# Patient Record
Sex: Female | Born: 1990 | Race: White | Hispanic: No | Marital: Married | Smoking: Never smoker
Health system: Southern US, Community
[De-identification: ages and names within clinical notes are randomized; demographics above are authoritative.]

## PROBLEM LIST (undated history)

## (undated) ENCOUNTER — Inpatient Hospital Stay (HOSPITAL_COMMUNITY): Payer: Self-pay

## (undated) DIAGNOSIS — J45909 Unspecified asthma, uncomplicated: Secondary | ICD-10-CM

## (undated) DIAGNOSIS — E039 Hypothyroidism, unspecified: Secondary | ICD-10-CM

## (undated) DIAGNOSIS — R112 Nausea with vomiting, unspecified: Secondary | ICD-10-CM

## (undated) DIAGNOSIS — Z9889 Other specified postprocedural states: Secondary | ICD-10-CM

## (undated) HISTORY — DX: Hypothyroidism, unspecified: E03.9

## (undated) HISTORY — DX: Unspecified asthma, uncomplicated: J45.909

---
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
## 2009-05-30 ENCOUNTER — Inpatient Hospital Stay (HOSPITAL_COMMUNITY): Admission: AD | Admit: 2009-05-30 | Discharge: 2009-06-03 | Payer: Self-pay | Admitting: Obstetrics and Gynecology

## 2009-06-05 ENCOUNTER — Inpatient Hospital Stay (HOSPITAL_COMMUNITY): Admission: AD | Admit: 2009-06-05 | Discharge: 2009-06-05 | Payer: Self-pay | Admitting: Obstetrics and Gynecology

## 2010-05-06 LAB — COMPREHENSIVE METABOLIC PANEL
AST: 21 U/L (ref 0–37)
Alkaline Phosphatase: 152 U/L — ABNORMAL HIGH (ref 39–117)
CO2: 24 mEq/L (ref 19–32)
Creatinine, Ser: 0.58 mg/dL (ref 0.4–1.2)
Glucose, Bld: 94 mg/dL (ref 70–99)
Potassium: 3.1 mEq/L — ABNORMAL LOW (ref 3.5–5.1)
Sodium: 136 mEq/L (ref 135–145)
Total Bilirubin: 0.7 mg/dL (ref 0.3–1.2)

## 2010-05-06 LAB — DIFFERENTIAL
Eosinophils Absolute: 0.2 10*3/uL (ref 0.0–0.7)
Neutro Abs: 13.5 10*3/uL — ABNORMAL HIGH (ref 1.7–7.7)
Neutrophils Relative %: 87 % — ABNORMAL HIGH (ref 43–77)

## 2010-05-06 LAB — CBC
HCT: 24.4 % — ABNORMAL LOW (ref 36.0–46.0)
Hemoglobin: 8.1 g/dL — ABNORMAL LOW (ref 12.0–15.0)
Hemoglobin: 8.4 g/dL — ABNORMAL LOW (ref 12.0–15.0)
MCV: 84.8 fL (ref 78.0–100.0)
MCV: 86.6 fL (ref 78.0–100.0)
RBC: 2.82 MIL/uL — ABNORMAL LOW (ref 3.87–5.11)
RBC: 2.84 MIL/uL — ABNORMAL LOW (ref 3.87–5.11)
RBC: 2.89 MIL/uL — ABNORMAL LOW (ref 3.87–5.11)
RDW: 14.7 % (ref 11.5–15.5)
RDW: 15.1 % (ref 11.5–15.5)
RDW: 15.1 % (ref 11.5–15.5)

## 2010-05-06 LAB — URINALYSIS, ROUTINE W REFLEX MICROSCOPIC
Bilirubin Urine: NEGATIVE
Glucose, UA: NEGATIVE mg/dL
Ketones, ur: NEGATIVE mg/dL
Urobilinogen, UA: 1 mg/dL (ref 0.0–1.0)

## 2010-05-06 LAB — URIC ACID: Uric Acid, Serum: 5.9 mg/dL (ref 2.4–7.0)

## 2010-05-06 LAB — LACTATE DEHYDROGENASE: LDH: 174 U/L (ref 94–250)

## 2010-05-07 LAB — CBC
HCT: 35.3 % — ABNORMAL LOW (ref 36.0–46.0)
Hemoglobin: 11.1 g/dL — ABNORMAL LOW (ref 12.0–15.0)
Hemoglobin: 11.9 g/dL — ABNORMAL LOW (ref 12.0–15.0)
MCHC: 33.6 g/dL (ref 30.0–36.0)
MCHC: 33.8 g/dL (ref 30.0–36.0)
Platelets: 214 10*3/uL (ref 150–400)
RBC: 3.85 MIL/uL — ABNORMAL LOW (ref 3.87–5.11)
WBC: 14 10*3/uL — ABNORMAL HIGH (ref 4.0–10.5)

## 2010-05-07 LAB — COMPREHENSIVE METABOLIC PANEL
ALT: 18 U/L (ref 0–35)
Albumin: 2.9 g/dL — ABNORMAL LOW (ref 3.5–5.2)
CO2: 24 mEq/L (ref 19–32)
Chloride: 101 mEq/L (ref 96–112)
GFR calc non Af Amer: >60 mL/min (ref >60)

## 2010-05-07 LAB — RPR: RPR Ser Ql: NONREACTIVE

## 2012-04-17 ENCOUNTER — Encounter: Payer: Self-pay | Admitting: Family Medicine

## 2012-04-17 DIAGNOSIS — E039 Hypothyroidism, unspecified: Secondary | ICD-10-CM | POA: Insufficient documentation

## 2012-04-17 DIAGNOSIS — J683 Other acute and subacute respiratory conditions due to chemicals, gases, fumes and vapors: Secondary | ICD-10-CM | POA: Insufficient documentation

## 2013-01-04 ENCOUNTER — Encounter (INDEPENDENT_AMBULATORY_CARE_PROVIDER_SITE_OTHER): Payer: Self-pay

## 2013-01-04 ENCOUNTER — Ambulatory Visit: Payer: BC Managed Care – PPO | Admitting: Family Medicine

## 2013-01-20 ENCOUNTER — Other Ambulatory Visit: Payer: Self-pay | Admitting: Obstetrics and Gynecology

## 2013-03-16 ENCOUNTER — Telehealth: Payer: Self-pay | Admitting: *Deleted

## 2013-03-16 NOTE — Telephone Encounter (Signed)
Mom had called the request for pt. I explained to mom she needs to be seen per Dr. Christell ConstantMoore. Verbalized understanding.

## 2013-03-16 NOTE — Telephone Encounter (Signed)
Patient really needs to come in and be seen I do not think she has been here in almost a year

## 2013-03-16 NOTE — Telephone Encounter (Signed)
She was exposed to flu. We treated someone that was a family member and wants to know if we will call in med for her. Sore throat, cough, chest congestion. Family member got zpak and flu medicine. [facility] [employer]

## 2013-03-21 ENCOUNTER — Telehealth: Payer: Self-pay | Admitting: Family Medicine

## 2013-03-21 ENCOUNTER — Encounter: Payer: Self-pay | Admitting: Family Medicine

## 2013-03-21 ENCOUNTER — Ambulatory Visit (INDEPENDENT_AMBULATORY_CARE_PROVIDER_SITE_OTHER): Payer: BC Managed Care – PPO | Admitting: Family Medicine

## 2013-03-21 VITALS — BP 129/83 | HR 102 | Temp 98.9°F | Ht 61.0 in | Wt 177.8 lb

## 2013-03-21 DIAGNOSIS — J069 Acute upper respiratory infection, unspecified: Secondary | ICD-10-CM

## 2013-03-21 MED ORDER — MECLIZINE HCL 25 MG PO TABS: 25.0000 mg | ORAL_TABLET | Freq: Three times a day (TID) | ORAL | Status: DC | PRN

## 2013-03-21 MED ORDER — METHYLPREDNISOLONE (PAK) 4 MG PO TABS: ORAL_TABLET | ORAL | Status: DC

## 2013-03-21 MED ORDER — AZITHROMYCIN 250 MG PO TABS: ORAL_TABLET | ORAL | Status: DC

## 2013-03-21 NOTE — Telephone Encounter (Signed)
Head congestion, nausea, and dizziness for several days. Symptoms aren't improving. Appt scheduled for this afternoon. Patient aware.

## 2013-03-21 NOTE — Progress Notes (Signed)
   Subjective:    Patient ID: Sara Clarke, female    DOB: 08/16/1990, 23 y.o.   MRN: [id number]  HPI  This 23 y.o. female presents for evaluation of uri sx's.  Review of Systems    No chest pain, SOB, HA, dizziness, vision change, N/V, diarrhea, constipation, dysuria, urinary urgency or frequency, myalgias, arthralgias or rash.  Objective:   Physical Exam  Vital signs noted  Well developed well nourished female.  HEENT - Head atraumatic Normocephalic                Eyes - PERRLA, Conjuctiva - clear Sclera- Clear EOMI                Ears - EAC's Wnl TM's Wnl Gross Hearing WNL                Nose - Nares patent                 Throat - oropharanx wnl Respiratory - Lungs CTA bilateral Cardiac - RRR S1 and S2 without murmur GI - Abdomen soft Nontender and bowel sounds active x 4 Extremities - No edema. Neuro - Grossly intact.      Assessment & Plan:  URI (upper respiratory infection) - Plan: methylPREDNIsolone (MEDROL DOSPACK) 4 MG tablet, azithromycin (ZITHROMAX) 250 MG tablet, meclizine (ANTIVERT) 25 MG tablet  Push po fluids, rest, tylenol and motrin otc prn as directed for fever, arthralgias, and myalgias.  Follow up prn if sx's continue or persist.  Deatra CanterWilliam J Lennard Capek FNP

## 2013-04-03 MED ORDER — AMOXICILLIN 500 MG PO CAPS: 500.0000 mg | ORAL_CAPSULE | Freq: Three times a day (TID) | ORAL | Status: DC

## 2013-04-03 NOTE — Telephone Encounter (Signed)
Patient at work and can not get off work and is complaining with sinus infection again, coughing, and sore throat is there any way we can call her something in

## 2013-04-20 ENCOUNTER — Ambulatory Visit (INDEPENDENT_AMBULATORY_CARE_PROVIDER_SITE_OTHER): Payer: BC Managed Care – PPO | Admitting: Family Medicine

## 2013-04-20 ENCOUNTER — Encounter: Payer: Self-pay | Admitting: Family Medicine

## 2013-04-20 VITALS — BP 116/79 | HR 89 | Temp 98.6°F | Wt 178.8 lb

## 2013-04-20 DIAGNOSIS — J209 Acute bronchitis, unspecified: Secondary | ICD-10-CM

## 2013-04-20 MED ORDER — OMEPRAZOLE 40 MG PO CPDR: 40.0000 mg | DELAYED_RELEASE_CAPSULE | Freq: Every day | ORAL | Status: DC

## 2013-04-20 MED ORDER — CIPROFLOXACIN HCL 500 MG PO TABS: 500.0000 mg | ORAL_TABLET | Freq: Two times a day (BID) | ORAL | Status: DC

## 2013-04-20 MED ORDER — HYDROCODONE-HOMATROPINE 5-1.5 MG/5ML PO SYRP: 5.0000 mL | ORAL_SOLUTION | Freq: Three times a day (TID) | ORAL | Status: DC | PRN

## 2013-04-20 NOTE — Progress Notes (Signed)
   Subjective:    Patient ID: Sara Clarke, female    DOB: 04/10/1990, 23 y.o.   MRN: [id number]  HPI This 23 y.o. female presents for evaluation of cough and uri sx's for over a week.   Review of Systems No chest pain, SOB, HA, dizziness, vision change, N/V, diarrhea, constipation, dysuria, urinary urgency or frequency, myalgias, arthralgias or rash.     Objective:   Physical Exam  Vital signs noted  Well developed well nourished female.  HEENT - Head atraumatic Normocephalic                Eyes - PERRLA, Conjuctiva - clear Sclera- Clear EOMI                Ears - EAC's Wnl TM's Wnl Gross Hearing WNL Respiratory - Lungs CTA bilateral Cardiac - RRR S1 and S2 without murmur GI - Abdomen soft Nontender and bowel sounds active x 4 Extremities - No edema. Neuro - Grossly intact.      Assessment & Plan:  Acute bronchitis - Plan: HYDROcodone-homatropine (HYCODAN) 5-1.5 MG/5ML syrup Cipro 500mg  one po bid x 10 days  Push po fluids, rest, tylenol and motrin otc prn as directed for fever, arthralgias, and myalgias.  Follow up prn if sx's continue or persist.  Deatra CanterWilliam J Oxford FNP

## 2013-05-01 ENCOUNTER — Telehealth: Payer: Self-pay | Admitting: Nurse Practitioner

## 2013-05-01 ENCOUNTER — Telehealth: Payer: Self-pay | Admitting: Family Medicine

## 2013-05-01 ENCOUNTER — Other Ambulatory Visit: Payer: Self-pay | Admitting: Family Medicine

## 2013-05-01 MED ORDER — FLUCONAZOLE 150 MG PO TABS
150.0000 mg | ORAL_TABLET | Freq: Once | ORAL | Status: DC
Start: 2013-05-01 — End: 2014-11-08

## 2013-05-01 NOTE — Telephone Encounter (Signed)
Error, should have gone to [facility]

## 2014-07-11 ENCOUNTER — Encounter (HOSPITAL_BASED_OUTPATIENT_CLINIC_OR_DEPARTMENT_OTHER): Payer: Self-pay | Admitting: *Deleted

## 2014-07-11 ENCOUNTER — Emergency Department (HOSPITAL_BASED_OUTPATIENT_CLINIC_OR_DEPARTMENT_OTHER)
Admission: EM | Admit: 2014-07-11 | Discharge: 2014-07-12 | Disposition: A | Discharge status: Home / Self Care | Payer: BLUE CROSS/BLUE SHIELD | Attending: Emergency Medicine | Admitting: Emergency Medicine

## 2014-07-11 ENCOUNTER — Emergency Department (HOSPITAL_BASED_OUTPATIENT_CLINIC_OR_DEPARTMENT_OTHER): Payer: BLUE CROSS/BLUE SHIELD

## 2014-07-11 DIAGNOSIS — Z79899 Other long term (current) drug therapy: Secondary | ICD-10-CM | POA: Diagnosis not present

## 2014-07-11 DIAGNOSIS — W108XXA Fall (on) (from) other stairs and steps, initial encounter: Secondary | ICD-10-CM | POA: Insufficient documentation

## 2014-07-11 DIAGNOSIS — Y9389 Activity, other specified: Secondary | ICD-10-CM | POA: Diagnosis not present

## 2014-07-11 DIAGNOSIS — J45909 Unspecified asthma, uncomplicated: Secondary | ICD-10-CM | POA: Diagnosis not present

## 2014-07-11 DIAGNOSIS — Y998 Other external cause status: Secondary | ICD-10-CM | POA: Insufficient documentation

## 2014-07-11 DIAGNOSIS — Y9289 Other specified places as the place of occurrence of the external cause: Secondary | ICD-10-CM | POA: Insufficient documentation

## 2014-07-11 DIAGNOSIS — Z7951 Long term (current) use of inhaled steroids: Secondary | ICD-10-CM | POA: Diagnosis not present

## 2014-07-11 DIAGNOSIS — S93401A Sprain of unspecified ligament of right ankle, initial encounter: Secondary | ICD-10-CM | POA: Diagnosis not present

## 2014-07-11 DIAGNOSIS — S99921A Unspecified injury of right foot, initial encounter: Secondary | ICD-10-CM | POA: Diagnosis present

## 2014-07-11 NOTE — ED Notes (Addendum)
Pt c/o right  foot injury x 30 mins ago

## 2014-07-12 NOTE — Discharge Instructions (Signed)

## 2014-07-12 NOTE — ED Provider Notes (Signed)
CSN: [id number]     Arrival date & time 07/11/14  2229 History   First MD Initiated Contact with Patient 07/11/14 2318     Chief Complaint  Patient presents with  . Foot Injury     (Consider location/radiation/quality/duration/timing/severity/associated sxs/prior Treatment) Patient is a 24 y.o. female presenting with foot injury.  Foot Injury Location:  Ankle Time since incident:  30 minutes Injury: yes   Mechanism of injury: fall   Fall:    Fall occurred:  Down stairs   Point of impact:  Feet   Entrapped after fall: no   Ankle location:  R ankle Pain details:    Quality:  Aching   Radiates to:  Does not radiate   Severity:  Moderate   Onset quality:  Sudden   Timing:  Constant   Progression:  Unchanged Relieved by:  Nothing Worsened by:  Bearing weight and flexion   Past Medical History  Diagnosis Date  . Asthma   . Hypothyroidism    Past Surgical History  Procedure Laterality Date  . Cesarean section     Family History  Problem Relation Age of Onset  . Thyroid disease Mother   . Hypertension Mother   . Hypertension Father    History  Substance Use Topics  . Smoking status: Never Smoker   . Smokeless tobacco: Not on file  . Alcohol Use: No   OB History    No data available     Review of Systems  All other systems reviewed and are negative.     Allergies  Review of patient's allergies indicates no known allergies.  Home Medications   Prior to Admission medications   Medication Sig Start Date End Date Taking? Authorizing Provider  albuterol (PROVENTIL HFA;VENTOLIN HFA) 108 (90 BASE) MCG/ACT inhaler Inhale 2 puffs into the lungs every 6 (six) hours as needed for wheezing.    Historical Provider, MD  beclomethasone (QVAR) 40 MCG/ACT inhaler Inhale 2 puffs into the lungs 2 (two) times daily.    Historical Provider, MD  ciprofloxacin (CIPRO) 500 MG tablet Take 1 tablet (500 mg total) by mouth 2 (two) times daily. 04/20/13   Deatra Canter, FNP   fluconazole (DIFLUCAN) 150 MG tablet Take 1 tablet (150 mg total) by mouth once. 05/01/13   Deatra Canter, FNP  HYDROcodone-homatropine [facility]) 5-1.5 MG/5ML syrup Take 5 mLs by mouth every 8 (eight) hours as needed for cough. 04/20/13   Deatra Canter, FNP  meclizine (ANTIVERT) 25 MG tablet Take 1 tablet (25 mg total) by mouth 3 (three) times daily as needed for dizziness. 03/21/13   Deatra Canter, FNP   BP 131/73 mmHg  Pulse 92  Temp(Src) 98.2 F (36.8 C) (Oral)  Resp 16  Ht 5' 1.5" (1.562 m)  Wt 180 lb (81.647 kg)  BMI 33.46 kg/m2  SpO2 100%  LMP 07/04/2014 Physical Exam  Constitutional: She is oriented to person, place, and time. She appears well-developed and well-nourished.  HENT:  Head: Normocephalic and atraumatic.  Right Ear: External ear normal.  Left Ear: External ear normal.  Eyes: Conjunctivae and EOM are normal. Pupils are equal, round, and reactive to light.  Neck: Normal range of motion. Neck supple.  Cardiovascular: Normal rate, regular rhythm, normal heart sounds and intact distal pulses.   Pulmonary/Chest: Effort normal and breath sounds normal.  Abdominal: Soft. Bowel sounds are normal. There is no tenderness.  Musculoskeletal:       Right ankle: She exhibits swelling and ecchymosis. She  exhibits normal range of motion and normal pulse.  Neurological: She is alert and oriented to person, place, and time.  Skin: Skin is warm and dry.  Vitals reviewed.   ED Course  Procedures (including critical care time) Labs Review Labs Reviewed - No data to display  Imaging Review Dg Ankle Complete Right  07/11/2014   CLINICAL DATA:  Fall down steps 1 hour ago with twisted ankle. Initial encounter.  EXAM: RIGHT ANKLE - COMPLETE 3+ VIEW  COMPARISON:  None.  FINDINGS: There is no evidence of fracture, dislocation, or joint effusion. There is no evidence of arthropathy or other focal bone abnormality. Soft tissues are unremarkable.  IMPRESSION: Negative.    Electronically Signed   By: Marnee SpringJonathon  Watts M.D.   On: 07/11/2014 23:43     EKG Interpretation None      MDM   Final diagnoses:  Right ankle sprain, initial encounter    24 y.o. female without pertinent PMH presents with R ankle pain after twisting injury on stair.  XR negative.  Likely sprain.  Placed in splint, will fu with PCP.    I have reviewed all laboratory and imaging studies if ordered as above  1. Right ankle sprain, initial encounter         Mirian MoMatthew Kato Wieczorek, MD 07/12/14 [phone]

## 2014-07-13 ENCOUNTER — Encounter: Payer: Self-pay | Admitting: Family Medicine

## 2014-07-13 ENCOUNTER — Ambulatory Visit (INDEPENDENT_AMBULATORY_CARE_PROVIDER_SITE_OTHER): Payer: BLUE CROSS/BLUE SHIELD | Admitting: Family Medicine

## 2014-07-13 VITALS — BP 119/75 | HR 88 | Ht 62.0 in | Wt 186.0 lb

## 2014-07-13 DIAGNOSIS — S93401A Sprain of unspecified ligament of right ankle, initial encounter: Secondary | ICD-10-CM

## 2014-07-13 NOTE — Patient Instructions (Signed)
You have an ankle sprain. Ice the area for 15 minutes at a time, 3-4 times a day Aleve 2 tabs twice a day with food OR ibuprofen 3 tabs three times a day with food for pain and inflammation. Elevate above the level of your heart when possible Crutches if needed to help with walking Bear weight when tolerated Use laceup ankle brace to help with stability while you recover from this injury (ok to use boot for 7 days first if needed). Come out of the boot/brace twice a day to do Up/down and alphabet exercises 2-3 sets of each. Consider physical therapy for strengthening and balance exercises in the future. If not improving as expected, we may repeat x-rays or consider further testing like an MRI. Follow up with me in 2 weeks.

## 2014-07-17 ENCOUNTER — Telehealth: Payer: Self-pay | Admitting: Family Medicine

## 2014-07-17 DIAGNOSIS — M25571 Pain in right ankle and joints of right foot: Secondary | ICD-10-CM | POA: Insufficient documentation

## 2014-07-17 NOTE — Addendum Note (Signed)
Addended by: Lenda KelpHUDNALL, Klani Caridi R on: 07/17/2014 02:10 PM   Modules accepted: Level of Service

## 2014-07-17 NOTE — Progress Notes (Signed)
PCP: Rudi HeapMOORE, DONALD, MD  Subjective:   HPI: Patient is a 24 y.o. female here for ankle injury.  Patient reports on 5/25 she accidentally fell down 2 steps, landed with foot underneath her. Difficulty bearing weight on right foot since then. Inverted ankle in the fall. No prior injuries. Swelling but no bruising. Given ASO though painful in this with the pressure. Taking tylenol. Walking on ball of foot.  Past Medical History  Diagnosis Date  . Asthma   . Hypothyroidism     Current Outpatient Prescriptions on File Prior to Visit  Medication Sig Dispense Refill  . albuterol (PROVENTIL HFA;VENTOLIN HFA) 108 (90 BASE) MCG/ACT inhaler Inhale 2 puffs into the lungs every 6 (six) hours as needed for wheezing.    . beclomethasone (QVAR) 40 MCG/ACT inhaler Inhale 2 puffs into the lungs 2 (two) times daily.    . ciprofloxacin (CIPRO) 500 MG tablet Take 1 tablet (500 mg total) by mouth 2 (two) times daily. 20 tablet 0  . fluconazole (DIFLUCAN) 150 MG tablet Take 1 tablet (150 mg total) by mouth once. 2 tablet 1  . HYDROcodone-homatropine (HYCODAN) 5-1.5 MG/5ML syrup Take 5 mLs by mouth every 8 (eight) hours as needed for cough. 120 mL 0  . meclizine (ANTIVERT) 25 MG tablet Take 1 tablet (25 mg total) by mouth 3 (three) times daily as needed for dizziness. 30 tablet 0   No current facility-administered medications on file prior to visit.    Past Surgical History  Procedure Laterality Date  . Cesarean section      No Known Allergies  History   Social History  . Marital Status: Married    Spouse Name: N/A  . Number of Children: N/A  . Years of Education: N/A   Occupational History  . Not on file.   Social History Main Topics  . Smoking status: Never Smoker   . Smokeless tobacco: Not on file  . Alcohol Use: No  . Drug Use: No  . Sexual Activity: Yes    Birth Control/ Protection: None   Other Topics Concern  . Not on file   Social History Narrative    Family History   Problem Relation Age of Onset  . Thyroid disease Mother   . Hypertension Mother   . Hypertension Father     BP 119/75 mmHg  Pulse 88  Ht 5\' 2"  (1.575 m)  Wt 186 lb (84.369 kg)  BMI 34.01 kg/m2  LMP 07/04/2014  Review of Systems: See HPI above.    Objective:  Physical Exam:  Gen: NAD  Right ankle: Mod lateral swelling.  No bruising, other deformity. Mod limitation ROM all directions. TTP over ATFL.  No focal bony tenderness. 1+ ant drawer, negative talar tilt.  Negative syndesmotic compression. Thompsons test negative. NV intact distally.    Assessment & Plan:  1. Right ankle injury - radiographs negative.  Reassured.  Icing, nsaids, elevation.  Shown motion exercises to start now.  Try cam walker initially then transition to ASO.  F/u in 2 weeks.  Consider PT, repeat imaging depending on how she's doing.

## 2014-07-17 NOTE — Assessment & Plan Note (Signed)
radiographs negative.  Reassured.  Icing, nsaids, elevation.  Shown motion exercises to start now.  Try cam walker initially then transition to ASO.  F/u in 2 weeks.  Consider PT, repeat imaging depending on how she's doing.

## 2014-07-27 ENCOUNTER — Encounter: Payer: Self-pay | Admitting: Family Medicine

## 2014-07-27 ENCOUNTER — Ambulatory Visit (INDEPENDENT_AMBULATORY_CARE_PROVIDER_SITE_OTHER): Payer: BLUE CROSS/BLUE SHIELD | Admitting: Family Medicine

## 2014-07-27 VITALS — BP 118/79 | HR 102 | Wt 186.0 lb

## 2014-07-27 DIAGNOSIS — S93401A Sprain of unspecified ligament of right ankle, initial encounter: Secondary | ICD-10-CM

## 2014-07-27 DIAGNOSIS — S93401D Sprain of unspecified ligament of right ankle, subsequent encounter: Secondary | ICD-10-CM

## 2014-07-27 NOTE — Patient Instructions (Signed)
You have an ankle sprain. Ice the area for 15 minutes at a time, 3-4 times a day Aleve 2 tabs twice a day with food OR ibuprofen 3 tabs three times a day with food for pain and inflammation. Elevate above the level of your heart when possible Crutches if needed to help with walking Bear weight when tolerated Transition from the boot to the laceup brace as tolerated now. Start physical therapy to accelerate the recover process. Do home exercises on days you don't go to therapy. Follow up with me in 4 weeks. If still not improving then we will do an MRI.

## 2014-07-30 NOTE — Progress Notes (Signed)
PCP: Rudi Heap, MD  Subjective:   HPI: Patient is a 24 y.o. female here for ankle injury.  5/27: Patient reports on 5/25 she accidentally fell down 2 steps, landed with foot underneath her. Difficulty bearing weight on right foot since then. Inverted ankle in the fall. No prior injuries. Swelling but no bruising. Given ASO though painful in this with the pressure. Taking tylenol. Walking on ball of foot.  6/10: Patient reports she feels about the same. Doing HEP, wearing boot. + swelling. Pain lateral and medial.  Past Medical History  Diagnosis Date  . Asthma   . Hypothyroidism     Current Outpatient Prescriptions on File Prior to Visit  Medication Sig Dispense Refill  . albuterol (PROVENTIL HFA;VENTOLIN HFA) 108 (90 BASE) MCG/ACT inhaler Inhale 2 puffs into the lungs every 6 (six) hours as needed for wheezing.    . beclomethasone (QVAR) 40 MCG/ACT inhaler Inhale 2 puffs into the lungs 2 (two) times daily.    . ciprofloxacin (CIPRO) 500 MG tablet Take 1 tablet (500 mg total) by mouth 2 (two) times daily. 20 tablet 0  . fluconazole (DIFLUCAN) 150 MG tablet Take 1 tablet (150 mg total) by mouth once. 2 tablet 1  . HYDROcodone-homatropine (HYCODAN) 5-1.5 MG/5ML syrup Take 5 mLs by mouth every 8 (eight) hours as needed for cough. 120 mL 0  . meclizine (ANTIVERT) 25 MG tablet Take 1 tablet (25 mg total) by mouth 3 (three) times daily as needed for dizziness. 30 tablet 0   No current facility-administered medications on file prior to visit.    Past Surgical History  Procedure Laterality Date  . Cesarean section      No Known Allergies  History   Social History  . Marital Status: Married    Spouse Name: N/A  . Number of Children: N/A  . Years of Education: N/A   Occupational History  . Not on file.   Social History Main Topics  . Smoking status: Never Smoker   . Smokeless tobacco: Not on file  . Alcohol Use: No  . Drug Use: No  . Sexual Activity: Yes   Birth Control/ Protection: None   Other Topics Concern  . Not on file   Social History Narrative    Family History  Problem Relation Age of Onset  . Thyroid disease Mother   . Hypertension Mother   . Hypertension Father     BP 118/79 mmHg  Pulse 102  Wt 186 lb (84.369 kg)  LMP 07/04/2014  Review of Systems: See HPI above.    Objective:  Physical Exam:  Gen: NAD  Right ankle: Mild lateral swelling.  No bruising, other deformity. Mild limitation ROM all directions. TTP over ATFL.  No focal bony tenderness. 1+ ant drawer, negative talar tilt.  Negative syndesmotic compression. Thompsons test negative. NV intact distally.    Assessment & Plan:  1. Right ankle injury - radiographs negative.  Reassured.  Not much improvement from last visit.  Will continue with icing, nsaids, elevation.  Try to transition to ASO.  Start physical therapy as well to accelerate recovery process.  F/u in 4 weeks.  Consider MRI if not improving.

## 2014-07-30 NOTE — Assessment & Plan Note (Signed)
radiographs negative.  Reassured.  Not much improvement from last visit.  Will continue with icing, nsaids, elevation.  Try to transition to ASO.  Start physical therapy as well to accelerate recovery process.  F/u in 4 weeks.  Consider MRI if not improving.

## 2014-08-07 ENCOUNTER — Ambulatory Visit: Payer: BLUE CROSS/BLUE SHIELD | Attending: Family Medicine | Admitting: Physical Therapy

## 2014-08-07 DIAGNOSIS — M25571 Pain in right ankle and joints of right foot: Secondary | ICD-10-CM | POA: Diagnosis present

## 2014-08-07 NOTE — Therapy (Signed)
[facility] [address] [place], [place], [address] Phone: [phone]   Fax:  [phone]  Physical Therapy Evaluation  Patient Details  Name: Sara Clarke MRN: [id number] Date of Birth: [birth year] Referring Provider:  Lenda Kelp, MD  Encounter Date: 08/07/2014      PT End of Session - 08/07/14 1030    Visit Number 1   Number of Visits 12   Date for PT Re-Evaluation 09/18/14   PT Start Time 0945   PT Stop Time 1031   PT Time Calculation (min) 46 min   Activity Tolerance Patient tolerated treatment well   Behavior During Therapy [facility] for tasks assessed/performed      Past Medical History  Diagnosis Date  . Asthma   . Hypothyroidism     Past Surgical History  Procedure Laterality Date  . Cesarean section      There were no vitals filed for this visit.  Visit Diagnosis:  Right ankle pain - Plan: PT plan of care cert/re-cert      Subjective Assessment - 08/07/14 1041    Subjective I'm afraid my ankle is going to roll over again. It feel like my ankle is dislocating when I move it.   Limitations Walking            [facility] PT Assessment - 08/07/14 0001    Assessment   Medical Diagnosis Right ankle sprain.   Onset Date/Surgical Date --  07/11/14.   Precautions   Precautions --  Wean to ASO brace.   Restrictions   Weight Bearing Restrictions Yes  In CAM walker/boot today.   Balance Screen   Has the patient fallen in the past 6 months Yes   How many times? 1   Has the patient had a decrease in activity level because of a fear of falling?  No   Is the patient reluctant to leave their home because of a fear of falling?  No   Home Tourist information centre manager residence   Prior Function   Level of Independence Independent   ROM / Strength   AROM / PROM / Strength AROM;Strength   AROM   Overall AROM Comments Full active riht ankle range of motion.   Strength   Overall Strength --  Right  ankle eversion strength= 4/5.   Palpation   Palpation comment Tender around right lateral malleolus and also over deltoid ligament.   Special Tests    Special Tests --  Minimal right lateral ankle edema.   Ambulation/Gait   Gait Comments Independent ambullation with a right CAM walker/boot.                   [facility] Adult PT Treatment/Exercise - 08/07/14 0001    Modalities   Modalities Cryotherapy;Electrical Stimulation   Cryotherapy   Number Minutes Cryotherapy 15 Minutes   Cryotherapy Location --  Right ankle.   Type of Cryotherapy --  Medium vasopneumatic.   Programme researcher, broadcasting/film/video Location --  Right ankle.   Electrical Stimulation Action --  IFC x 15 minutes.   Electrical Stimulation Goals Pain                     PT Long Term Goals - 08/07/14 1038    PT LONG TERM GOAL #1   Title Ind with advanced HEP.   Time 6   Period Weeks   Status New   PT LONG TERM GOAL #2  Title 5/5 right ankle strength to increase stability for functional activites.   Time 6   Period Weeks   Status New   PT LONG TERM GOAL #3   Title Normal right nakle stability.   Time 6   Period Weeks   Status New   PT LONG TERM GOAL #4   Title Walk a normal daily distance for patient with right ankle pain not > 2/10.   Time 6   Period Weeks   Status New               Plan - 08/07/14 1031    Clinical Impression Statement The patient was walking down stairs in the dark with flip-flops on 07/11/14 when she rolled over her right ankle.  She reported a great deal of pain and swelling.  She is in a CAM walker/boot but can wean to an ASO brace.  She is afraid to go to the brace at this time as she is scared she will roll her ankle again.  Her resting pain-level is a 3-4/10 and higher (5-6/10) with increased walking which also causes her ankle to swell.   Pt will benefit from skilled therapeutic intervention in order to improve on the following deficits  Abnormal gait;Decreased activity tolerance;Pain   Rehab Potential Excellent   PT Frequency 3x / week   PT Duration 6 weeks   PT Treatment/Interventions ADLs/Self Care Home Management;Cryotherapy;Academic librarian;Therapeutic exercise;Neuromuscular re-education;Patient/family education;Manual techniques;Passive range of motion;Vasopneumatic Device   PT Next Visit Plan Stationary bike; parallel bars---rockerboard and dynadisc.  Ankle isolator and theraband strengthening exercises to right ankle.  Proproceptive activities.  Weam from CAM walker/boot to ASO.         Problem List Patient Active Problem List   Diagnosis Date Noted  . Right ankle sprain 07/17/2014  . Unspecified hypothyroidism 04/17/2012  . 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 [place], [place], [address] Phone: [phone]   Fax:  [phone]

## 2014-08-09 ENCOUNTER — Ambulatory Visit: Payer: BLUE CROSS/BLUE SHIELD | Admitting: Physical Therapy

## 2014-08-09 DIAGNOSIS — M25571 Pain in right ankle and joints of right foot: Secondary | ICD-10-CM | POA: Diagnosis not present

## 2014-08-09 NOTE — Therapy (Signed)
[facility] [address] [place], [place], [address] Phone: [phone]   Fax:  [phone]  Physical Therapy Treatment  Patient Details  Name: Sara Clarke MRN: [id number] Date of Birth: Oct 17, 1990 Referring Provider:  Ernestina Penna, MD  Encounter Date: 08/09/2014      PT End of Session - 08/09/14 1016    Visit Number 2   Number of Visits 12   Date for PT Re-Evaluation 09/18/14   PT Start Time 0952   PT Stop Time 1028   PT Time Calculation (min) 36 min   Activity Tolerance Patient tolerated treatment well   Behavior During Therapy [facility] for tasks assessed/performed      Past Medical History  Diagnosis Date  . Asthma   . Hypothyroidism     Past Surgical History  Procedure Laterality Date  . Cesarean section      There were no vitals filed for this visit.  Visit Diagnosis:  Right ankle pain      Subjective Assessment - 08/09/14 1017    Subjective Forgot my brace.  Told patient next time to bring regular footwear and brcae so we can start some gym-related ankle rehab.   Limitations Walking   How long can you walk comfortably? 20 minutes+.   Pain Score 4    Pain Location Ankle   Pain Orientation Right   Pain Descriptors / Indicators Aching;Constant   Pain Onset 1 to 4 weeks ago                         [facility] Adult PT Treatment/Exercise - 08/09/14 1031    Modalities   Modalities Cryotherapy;Electrical Stimulation   Cryotherapy   Number Minutes Cryotherapy 20 Minutes   Cryotherapy Location --  Right ankle.   Type of Cryotherapy --  Medium vasopneumatic.   Programme researcher, broadcasting/film/video Location --  Right ankle lateral ligaments.   Electrical Stimulation Action --  Pre-mod (5 sec on and 5 sec off) x 20 minutes.   Manual Therapy   Manual therapy comments --  STW/M x 8 minutes to affected rt ankle ligaments.                     PT Long Term Goals - 08/07/14  1038    PT LONG TERM GOAL #1   Title Ind with advanced HEP.   Time 6   Period Weeks   Status New   PT LONG TERM GOAL #2   Title 5/5 right ankle strength to increase stability for functional activites.   Time 6   Period Weeks   Status New   PT LONG TERM GOAL #3   Title Normal right nakle stability.   Time 6   Period Weeks   Status New   PT LONG TERM GOAL #4   Title Walk a normal daily distance for patient with right ankle pain not > 2/10.   Time 6   Period Weeks   Status New               Problem List Patient Active Problem List   Diagnosis Date Noted  . Right ankle sprain 07/17/2014  . Unspecified hypothyroidism 04/17/2012  . 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 [place], [place], [address] Phone: [phone]   Fax:  [phone]

## 2014-08-13 ENCOUNTER — Encounter: Payer: Self-pay | Admitting: Physical Therapy

## 2014-08-13 ENCOUNTER — Ambulatory Visit: Payer: BLUE CROSS/BLUE SHIELD | Admitting: Physical Therapy

## 2014-08-13 DIAGNOSIS — M25571 Pain in right ankle and joints of right foot: Secondary | ICD-10-CM | POA: Diagnosis not present

## 2014-08-13 NOTE — Therapy (Signed)
[facility] [address] [place], [place], [address] Phone: [phone]   Fax:  [phone]  Physical Therapy Treatment  Patient Details  Name: Sara Clarke MRN: [id number] Date of Birth: 1990/12/20 Referring Provider:  Ernestina Penna, MD  Encounter Date: 08/13/2014      PT End of Session - 08/13/14 0958    Visit Number 3   Number of Visits 12   Date for PT Re-Evaluation 09/18/14   PT Start Time 0953   PT Stop Time 1037   PT Time Calculation (min) 44 min   Equipment Utilized During Treatment Other (comment)  R ankle brace   Activity Tolerance Patient tolerated treatment well   Behavior During Therapy [facility] for tasks assessed/performed      Past Medical History  Diagnosis Date  . Asthma   . Hypothyroidism     Past Surgical History  Procedure Laterality Date  . Cesarean section      There were no vitals filed for this visit.  Visit Diagnosis:  Right ankle pain      Subjective Assessment - 08/13/14 0955    Subjective Reports that R ankle feels about the same today and hurts. Is on antibiotic and cough meds at this time. States that R ankle feels like its disconnecting when she picks it up or inverts R ankle. Has R ankle brace donned during appointment. Reports continued compliance with HEP given by MD and ice compliance. Continues to report lateral R ankle swelling.   Limitations Walking   How long can you walk comfortably? 20 minutes+.   Currently in Pain? Yes   Pain Score 5    Pain Location Ankle   Pain Orientation Right;Medial;Lateral   Pain Descriptors / Indicators Aching;Jabbing   Pain Onset 1 to 4 weeks ago   Pain Frequency Intermittent   Aggravating Factors  Ambulation   Pain Relieving Factors Rest            [facility] PT Assessment - 08/13/14 0001    Assessment   Medical Diagnosis Right ankle sprain.   Onset Date/Surgical Date 07/11/14   Next MD Visit 08/2014                     [facility]  Adult PT Treatment/Exercise - 08/13/14 0001    Exercises   Exercises Ankle   Modalities   Modalities Cryotherapy   Cryotherapy   Number Minutes Cryotherapy 10 Minutes   Cryotherapy Location Ankle   Type of Cryotherapy Other (comment)  Vasopneumatic   Ankle Exercises: Stretches   Slant Board Stretch 3 reps;30 seconds   Ankle Exercises: Aerobic   Stationary Bike x8 min   Ankle Exercises: Standing   Rocker Board 3 minutes   Other Standing Ankle Exercises Dynadisc 12-6 x3 min, 3-9 x3 min   Ankle Exercises: Seated   ABC's 1 rep   Other Seated Ankle Exercises 4D ankle strengthening yellow theraband 3x10 reps                PT Education - 08/13/14 1000    Education provided Yes   Education Details HEP- 4D ankle strengthening with yellow theraband given   Person(s) Educated Patient   Methods Explanation;Demonstration;Verbal cues;Handout   Comprehension Verbalized understanding;Returned demonstration             PT Long Term Goals - 08/13/14 1019    PT LONG TERM GOAL #1   Title Ind with advanced HEP.   Time 6   Period Weeks  Status On-going   PT LONG TERM GOAL #2   Title 5/5 right ankle strength to increase stability for functional activites.   Time 6   Period Weeks   Status On-going   PT LONG TERM GOAL #3   Title Normal right nakle stability.   Time 6   Period Weeks   Status On-going   PT LONG TERM GOAL #4   Title Walk a normal daily distance for patient with right ankle pain not > 2/10.   Time 6   Period Weeks   Status On-going               Plan - 08/13/14 1027    Clinical Impression Statement Patient tolerated treatment well without complaint of pain during exercises that were completed. Accepted new HEP exercises and yellow theraband today. Secondary to report of R ankle swelling, vasopneumatic system completed in efforts to decrease R ankle swelling. Minimal medial and lateral R ankle edema observed during palpation. Normal modalties response  noted following removal of the modalties. Goals remain on-going secondary to pain during ambulation, decreased R ankle stability and strength. Experienced 3/10 pain in R ankle following treatment.   Pt will benefit from skilled therapeutic intervention in order to improve on the following deficits Abnormal gait;Decreased activity tolerance;Pain   Rehab Potential Excellent   PT Frequency 3x / week   PT Duration 6 weeks   PT Treatment/Interventions ADLs/Self Care Home Management;Cryotherapy;Academic librarianlectrical Stimulation;Moist Heat;Gait training;Therapeutic exercise;Neuromuscular re-education;Patient/family education;Manual techniques;Passive range of motion;Vasopneumatic Device   PT Next Visit Plan Continue per MPT POC.   Consulted and Agree with Plan of Care Patient        Problem List Patient Active Problem List   Diagnosis Date Noted  . Right ankle sprain 07/17/2014  . Unspecified hypothyroidism 04/17/2012  . 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

## 2014-08-13 NOTE — Patient Instructions (Signed)
Dorsiflexion: Resisted   Facing anchor, tubing around left foot, pull toward face.  Repeat _10___ times per set. Do _2-3___ sets per session. Do _2-3___ sessions per day.  http://orth.exer.us/8   Copyright  VHI. All rights reserved.  Plantar Flexion: Resisted   Anchor behind, tubing around left foot, press down. Repeat __10__ times per set. Do _2-3___ sets per session. Do _2-3___ sessions per day.  http://orth.exer.us/10   Copyright  VHI. All rights reserved.  Inversion: Resisted   Cross legs with right leg underneath, foot in tubing loop. Hold tubing around other foot to resist and turn foot in. Repeat __10__ times per set. Do _2-3___ sets per session. Do _2-3___ sessions per day.  http://orth.exer.us/12   Copyright  VHI. All rights reserved.  Eversion: Resisted   With right foot in tubing loop, hold tubing around other foot to resist and turn foot out. Repeat _10___ times per set. Do _2-3___ sets per session. Do __2-3__ sessions per day.  http://orth.exer.us/14   Copyright  VHI. All rights reserved.

## 2014-08-16 ENCOUNTER — Ambulatory Visit: Payer: BLUE CROSS/BLUE SHIELD | Admitting: *Deleted

## 2014-08-16 ENCOUNTER — Encounter: Payer: Self-pay | Admitting: *Deleted

## 2014-08-16 DIAGNOSIS — M25571 Pain in right ankle and joints of right foot: Secondary | ICD-10-CM

## 2014-08-16 NOTE — Therapy (Signed)
[facility] [address] [place], [place], [address] Phone: [phone]   Fax:  [phone]  Physical Therapy Treatment  Patient Details  Name: Sara Clarke MRN: [id number] Date of Birth: 07/25/1990 Referring Provider:  Ernestina PennaMoore, Donald W, MD  Encounter Date: 08/16/2014      PT End of Session - 08/16/14 1040    Visit Number 4   Number of Visits 12   Date for PT Re-Evaluation 09/18/14   PT Start Time 0945   PT Stop Time 1035   PT Time Calculation (min) 50 min      Past Medical History  Diagnosis Date  . Asthma   . Hypothyroidism     Past Surgical History  Procedure Laterality Date  . Cesarean section      There were no vitals filed for this visit.  Visit Diagnosis:  Right ankle pain      Subjective Assessment - 08/16/14 1007    Subjective Reports that R ankle feels about the same today and hurts. Is on antibiotic and cough meds at this time. States that R ankle feels like its disconnecting when she picks it up or inverts R ankle. Has R ankle brace donned during appointment. Reports continued compliance with HEP given by MD and ice compliance. Continues to report lateral R ankle swelling.   Limitations Walking   How long can you walk comfortably? 20 minutes+.   Currently in Pain? Yes   Pain Score 5    Pain Location Ankle   Pain Orientation Right;Medial;Lateral   Pain Descriptors / Indicators Aching   Pain Onset More than a month ago   Pain Frequency Intermittent   Aggravating Factors  walking   Pain Relieving Factors ice                         [facility] Adult PT Treatment/Exercise - 08/16/14 0001    Exercises   Exercises Ankle   Cryotherapy   Number Minutes Cryotherapy 15 Minutes   Cryotherapy Location Ankle   Type of Cryotherapy Ice pack  Vasopnuematic   Electrical Stimulation   Electrical Stimulation Location RT ankle   Electrical Stimulation Action IFC   Electrical Stimulation Parameters  1-10hz  x 15 mins   Electrical Stimulation Goals Pain;Edema   Manual Therapy   Manual Therapy --  Manual resistance for isometrics for eversion and inversion   Ankle Exercises: Aerobic   Tread Mill Nustep L4 x10 mins    Ankle Exercises: Standing   Other Standing Ankle Exercises all motions no brace, 3x10 each   Ankle Exercises: Seated   Other Seated Ankle Exercises rocker board no brace DF/PF x 3 min   Other Seated Ankle Exercises Dyna disc all motions x3 mins                     PT Long Term Goals - 08/13/14 1019    PT LONG TERM GOAL #1   Title Ind with advanced HEP.   Time 6   Period Weeks   Status On-going   PT LONG TERM GOAL #2   Title 5/5 right ankle strength to increase stability for functional activites.   Time 6   Period Weeks   Status On-going   PT LONG TERM GOAL #3   Title Normal right nakle stability.   Time 6   Period Weeks   Status On-going   PT LONG TERM GOAL #4   Title Walk a normal daily distance  for patient with right ankle pain not > 2/10.   Time 6   Period Weeks   Status On-going               Plan - 08/16/14 1041    Clinical Impression Statement Pt tolerated Rx fairly well with only minimal pain increase during some of the exs. She was able to perform the exs without the brace and was challenged with proprioception Act.'s. Swelling of RT ankle was lower than it has been. To MD next week.    Pt will benefit from skilled therapeutic intervention in order to improve on the following deficits Abnormal gait;Decreased activity tolerance;Pain   Rehab Potential Excellent   PT Frequency 3x / week   PT Duration 6 weeks   PT Treatment/Interventions ADLs/Self Care Home Management;Cryotherapy;Academic librarian;Therapeutic exercise;Neuromuscular re-education;Patient/family education;Manual techniques;Passive range of motion;Vasopneumatic Device   PT Next Visit Plan Continue per MPT POC.        Problem  List Patient Active Problem List   Diagnosis Date Noted  . Right ankle sprain 07/17/2014  . Unspecified hypothyroidism 04/17/2012  . 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 [place], [place], [address] Phone: [phone]   Fax:  [phone]

## 2014-08-22 ENCOUNTER — Encounter: Payer: Self-pay | Admitting: *Deleted

## 2014-08-22 ENCOUNTER — Ambulatory Visit: Payer: BLUE CROSS/BLUE SHIELD | Attending: Family Medicine | Admitting: *Deleted

## 2014-08-22 DIAGNOSIS — M25571 Pain in right ankle and joints of right foot: Secondary | ICD-10-CM | POA: Insufficient documentation

## 2014-08-22 NOTE — Therapy (Signed)
[facility] [address] [place], [place], [address] Phone: [phone]   Fax:  [phone]  Physical Therapy Treatment  Patient Details  Name: Sara Clarke MRN: [id number] Date of Birth: [birth year] Referring Provider:  Ernestina Penna, MD  Encounter Date: 08/22/2014      PT End of Session - 08/22/14 1123    Visit Number 5   Number of Visits 12   Date for PT Re-Evaluation 09/18/14   PT Start Time 1032   PT Stop Time 1122   PT Time Calculation (min) 50 min      Past Medical History  Diagnosis Date  . Asthma   . Hypothyroidism     Past Surgical History  Procedure Laterality Date  . Cesarean section      There were no vitals filed for this visit.  Visit Diagnosis:  Right ankle pain      Subjective Assessment - 08/22/14 1051    Subjective RT ankle 4/10 today. Turned it in my yard this morning. wearing brace less.To MD tomorrow. RT ankle still feels unstable. Foot feels like it detaches when it is hanging.   Limitations Walking   How long can you walk comfortably? 20 minutes+.   Currently in Pain? Yes   Pain Score 4    Pain Location Ankle   Pain Orientation Right;Medial;Lateral   Pain Descriptors / Indicators Aching   Pain Onset More than a month ago   Pain Frequency Intermittent   Aggravating Factors  walking            [facility] PT Assessment - 08/22/14 0001    AROM   Overall AROM Comments Knee extended  DF 5 degrees, PF 50 degrees                     [facility] Adult PT Treatment/Exercise - 08/22/14 0001    Exercises   Exercises Ankle   Modalities   Modalities Cryotherapy   Electrical Stimulation   Electrical Stimulation Location RT ankle   Electrical Stimulation Action IFC   Electrical Stimulation Parameters 1-10hz  x 15 mins   Electrical Stimulation Goals Pain;Edema   Ankle Exercises: Aerobic   Stationary Bike x 12 mins L1   Ankle Exercises: Standing   Rocker Board 4 minutes  PF/DF and  balance   Other Standing Ankle Exercises SLS on RT LE on/off x 3 mins with toe touch LT foot   Ankle Exercises: Seated   BAPS Level 2;Sitting  x 5 mins all motions                     PT Long Term Goals - 08/13/14 1019    PT LONG TERM GOAL #1   Title Ind with advanced HEP.   Time 6   Period Weeks   Status On-going   PT LONG TERM GOAL #2   Title 5/5 right ankle strength to increase stability for functional activites.   Time 6   Period Weeks   Status On-going   PT LONG TERM GOAL #3   Title Normal right nakle stability.   Time 6   Period Weeks   Status On-going   PT LONG TERM GOAL #4   Title Walk a normal daily distance for patient with right ankle pain not > 2/10.   Time 6   Period Weeks   Status On-going               Plan - 08/22/14 1407  Clinical Impression Statement Pt did fairly well today with exs and propriception act.'s, but still has notable deficits in balance and strength for RT ankle. She says she still needs the brace for stability, because she turned her ankle a little walking in the grass the other day. Exs in clinic have een performed without brace the last two Rxs. Goals are ongoing   Pt will benefit from skilled therapeutic intervention in order to improve on the following deficits Abnormal gait;Decreased activity tolerance;Pain   Rehab Potential Excellent   PT Frequency 3x / week   PT Duration 6 weeks   PT Treatment/Interventions ADLs/Self Care Home Management;Cryotherapy;Academic librarianlectrical Stimulation;Moist Heat;Gait training;Therapeutic exercise;Neuromuscular re-education;Patient/family education;Manual techniques;Passive range of motion;Vasopneumatic Device   PT Next Visit Plan Continue per MPT POC.  Route to MD        Problem List Patient Active Problem List   Diagnosis Date Noted  . Right ankle sprain 07/17/2014  . Unspecified hypothyroidism 04/17/2012  . 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

## 2014-08-23 ENCOUNTER — Encounter: Payer: Self-pay | Admitting: Family Medicine

## 2014-08-23 ENCOUNTER — Ambulatory Visit (INDEPENDENT_AMBULATORY_CARE_PROVIDER_SITE_OTHER): Payer: BLUE CROSS/BLUE SHIELD | Admitting: Family Medicine

## 2014-08-23 ENCOUNTER — Encounter (INDEPENDENT_AMBULATORY_CARE_PROVIDER_SITE_OTHER): Payer: Self-pay

## 2014-08-23 VITALS — BP 135/84 | HR 103 | Ht 62.0 in | Wt 187.0 lb

## 2014-08-23 DIAGNOSIS — S93401D Sprain of unspecified ligament of right ankle, subsequent encounter: Secondary | ICD-10-CM

## 2014-08-23 DIAGNOSIS — M25571 Pain in right ankle and joints of right foot: Secondary | ICD-10-CM

## 2014-08-23 NOTE — Patient Instructions (Signed)
We will go ahead with an MRI to assess for an osteochondral defect of your ankle. Continue current treatments, physical therapy in the meantime. Continue bracing.

## 2014-08-24 ENCOUNTER — Ambulatory Visit: Payer: BLUE CROSS/BLUE SHIELD | Admitting: Family Medicine

## 2014-08-27 ENCOUNTER — Ambulatory Visit (INDEPENDENT_AMBULATORY_CARE_PROVIDER_SITE_OTHER): Payer: BLUE CROSS/BLUE SHIELD

## 2014-08-27 ENCOUNTER — Ambulatory Visit: Payer: BLUE CROSS/BLUE SHIELD | Admitting: Physical Therapy

## 2014-08-27 ENCOUNTER — Encounter: Payer: Self-pay | Admitting: Physical Therapy

## 2014-08-27 DIAGNOSIS — S8361XD Sprain of the superior tibiofibular joint and ligament, right knee, subsequent encounter: Secondary | ICD-10-CM

## 2014-08-27 DIAGNOSIS — X58XXXA Exposure to other specified factors, initial encounter: Secondary | ICD-10-CM

## 2014-08-27 DIAGNOSIS — M2548 Effusion, other site: Secondary | ICD-10-CM

## 2014-08-27 DIAGNOSIS — S93401D Sprain of unspecified ligament of right ankle, subsequent encounter: Secondary | ICD-10-CM

## 2014-08-27 DIAGNOSIS — S93491D Sprain of other ligament of right ankle, subsequent encounter: Secondary | ICD-10-CM

## 2014-08-27 DIAGNOSIS — M25571 Pain in right ankle and joints of right foot: Secondary | ICD-10-CM | POA: Diagnosis not present

## 2014-08-27 NOTE — Assessment & Plan Note (Signed)
radiographs negative.  Reassured.  Despite PT, ASO, HEP she continues to have fairly significant pain 6 weeks out from injury.  Will go ahead with MRI to assess for OCD.

## 2014-08-27 NOTE — Progress Notes (Addendum)
PCP: Rudi HeapMOORE, DONALD, MD  Subjective:   HPI: Patient is a 24 y.o. female here for ankle injury.  5/27: Patient reports on 5/25 she accidentally fell down 2 steps, landed with foot underneath her. Difficulty bearing weight on right foot since then. Inverted ankle in the fall. No prior injuries. Swelling but no bruising. Given ASO though painful in this with the pressure. Taking tylenol. Walking on ball of foot.  6/10: Patient reports she feels about the same. Doing HEP, wearing boot. + swelling. Pain lateral and medial.  7/7: Patient again reports persistent pain at 5/10 level, up to 7/10 when walking. Doing physical therapy and wearing ASO. + swelling.  Past Medical History  Diagnosis Date  . Asthma   . Hypothyroidism     Current Outpatient Prescriptions on File Prior to Visit  Medication Sig Dispense Refill  . albuterol (PROVENTIL HFA;VENTOLIN HFA) 108 (90 BASE) MCG/ACT inhaler Inhale 2 puffs into the lungs every 6 (six) hours as needed for wheezing.    . beclomethasone (QVAR) 40 MCG/ACT inhaler Inhale 2 puffs into the lungs 2 (two) times daily.    . ciprofloxacin (CIPRO) 500 MG tablet Take 1 tablet (500 mg total) by mouth 2 (two) times daily. (Patient not taking: Reported on 08/07/2014) 20 tablet 0  . fluconazole (DIFLUCAN) 150 MG tablet Take 1 tablet (150 mg total) by mouth once. (Patient not taking: Reported on 08/07/2014) 2 tablet 1  . HYDROcodone-homatropine (HYCODAN) 5-1.5 MG/5ML syrup Take 5 mLs by mouth every 8 (eight) hours as needed for cough. (Patient not taking: Reported on 08/07/2014) 120 mL 0  . meclizine (ANTIVERT) 25 MG tablet Take 1 tablet (25 mg total) by mouth 3 (three) times daily as needed for dizziness. (Patient not taking: Reported on 08/07/2014) 30 tablet 0   No current facility-administered medications on file prior to visit.    Past Surgical History  Procedure Laterality Date  . Cesarean section      No Known Allergies  History   Social  History  . Marital Status: Married    Spouse Name: N/A  . Number of Children: N/A  . Years of Education: N/A   Occupational History  . Not on file.   Social History Main Topics  . Smoking status: Never Smoker   . Smokeless tobacco: Not on file  . Alcohol Use: No  . Drug Use: No  . Sexual Activity: Yes    Birth Control/ Protection: None   Other Topics Concern  . Not on file   Social History Narrative    Family History  Problem Relation Age of Onset  . Thyroid disease Mother   . Hypertension Mother   . Hypertension Father     BP 135/84 mmHg  Pulse 103  Ht 5\' 2"  (1.575 m)  Wt 187 lb (84.823 kg)  BMI 34.19 kg/m2  Review of Systems: See HPI above.    Objective:  Physical Exam:  Gen: NAD  Right ankle: Mild lateral swelling.  No bruising, other deformity. Mild limitation ROM all directions. TTP over ATFL, ant ankle joint.  No focal bony tenderness. 1+ ant drawer, negative talar tilt.  Negative syndesmotic compression. Thompsons test negative. NV intact distally.    Assessment & Plan:  1. Right ankle injury - radiographs negative.  Reassured.  Despite PT, ASO, HEP she continues to have fairly significant pain 6 weeks out from injury.  Will go ahead with MRI to assess for OCD.  Addendum:  MRI reviewed and discussed with patient.  No  evidence OCD or other bony abnormalities.  Has partial ATFL tear as expected with ankle sprain.  Would continue with current treatment - PT, HEP, ASO and f/u in 6 weeks.  Continue light duty.

## 2014-08-27 NOTE — Therapy (Signed)
[facility] [place], [place], [address] Phone: [phone]   Fax:  [phone]  Physical Therapy Treatment  Patient Details  Name: Sara Clarke MRN: [id number] Date of Birth: 1990-07-08 Referring Provider:  Chipper Herb, MD  Encounter Date: 08/27/2014      PT End of Session - 08/27/14 0955    Visit Number 6   Number of Visits 12   Date for PT Re-Evaluation 09/18/14   PT Start Time 0953   PT Stop Time 1031   PT Time Calculation (min) 38 min   Activity Tolerance Patient tolerated treatment well   Behavior During Therapy [facility] for tasks assessed/performed      Past Medical History  Diagnosis Date  . Asthma   . Hypothyroidism     Past Surgical History  Procedure Laterality Date  . Cesarean section      There were no vitals filed for this visit.  Visit Diagnosis:  Right ankle pain      Subjective Assessment - 08/27/14 0954    Subjective Reports that R ankle feels the same today as it has before. Went to MD and he said it is not healed yet. Patient goes for an MRI later today. Did not have brace donned today but walks slowly.   Limitations Walking   How long can you walk comfortably? 20 minutes+.   Currently in Pain? Yes   Pain Score 4    Pain Location Ankle   Pain Orientation Right;Medial;Lateral   Pain Descriptors / Indicators Aching   Pain Onset More than a month ago   Pain Frequency Intermittent            [facility] PT Assessment - 08/27/14 0001    Assessment   Medical Diagnosis Right ankle sprain.   Onset Date/Surgical Date 07/11/14   Next MD Visit 09/06/2014                     [facility] Adult PT Treatment/Exercise - 08/27/14 0001    Ankle Exercises: Aerobic   Stationary Bike x8 min   Ankle Exercises: Stretches   Slant Board Stretch 3 reps;30 seconds   Ankle Exercises: Seated   BAPS Sitting;Level 2  x30 reps each direction   Other Seated Ankle Exercises R ankle dorsiflexion  ankle isolator 2# 3 x10 rpes   Other Seated Ankle Exercises Seated BOSU RLE all directions x5 min   Ankle Exercises: Sidelying   Ankle Inversion Strengthening;Right;Other reps (comment)  3 x10 reps; ankle isolator 2#   Ankle Eversion Strengthening;Right;Other reps (comment)  3x10 reps; ankle isolator 2#   Other Sidelying Ankle Exercises R ankle PF prone ankle isolator 2# 3 x10 reps             Balance Exercises - 08/27/14 1016    Balance Exercises: Standing   SLS with Vectors Foam/compliant surface;Intermittent upper extremity assist;Time  x3 min   Rockerboard Lateral;Intermittent UE support;Other (comment)  x3 min   Other Standing Exercises RLE star stance 5 pt x10 reps                PT Long Term Goals - 08/13/14 1019    PT LONG TERM GOAL #1   Title Ind with advanced HEP.   Time 6   Period Weeks   Status On-going   PT LONG TERM GOAL #2   Title 5/5 right ankle strength to increase stability for functional activites.   Time 6   Period Weeks  Status On-going   PT LONG TERM GOAL #3   Title Normal right nakle stability.   Time 6   Period Weeks   Status On-going   PT LONG TERM GOAL #4   Title Walk a normal daily distance for patient with right ankle pain not > 2/10.   Time 6   Period Weeks   Status On-going               Plan - 08/27/14 1035    Clinical Impression Statement Patient was 8 minutes late for treatment today. Patient did fairly well but continues to have deficits with ROM, strength in R ankle. No goals were met today secondary to decreased strength, stability, and pain experienced. Experienced 6/10 cramping sensation following treatment.   Pt will benefit from skilled therapeutic intervention in order to improve on the following deficits Abnormal gait;Decreased activity tolerance;Pain   Rehab Potential Excellent   PT Frequency 3x / week   PT Duration 6 weeks   PT Treatment/Interventions ADLs/Self Care Home  Management;Cryotherapy;Software engineer;Therapeutic exercise;Neuromuscular re-education;Patient/family education;Manual techniques;Passive range of motion;Vasopneumatic Device   PT Next Visit Plan Continue per MPT POC.     Consulted and Agree with Plan of Care Patient        Problem List Patient Active Problem List   Diagnosis Date Noted  . Right ankle pain 07/17/2014  . Unspecified hypothyroidism 04/17/2012  . 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

## 2014-08-28 ENCOUNTER — Encounter: Payer: Self-pay | Admitting: Family Medicine

## 2014-08-29 ENCOUNTER — Encounter: Payer: Self-pay | Admitting: Physical Therapy

## 2014-08-29 ENCOUNTER — Ambulatory Visit: Payer: BLUE CROSS/BLUE SHIELD | Admitting: Physical Therapy

## 2014-08-29 DIAGNOSIS — M25571 Pain in right ankle and joints of right foot: Secondary | ICD-10-CM | POA: Diagnosis not present

## 2014-08-29 NOTE — Therapy (Signed)
[facility] [address] [place], [place], [address] Phone: [phone]   Fax:  [phone]  Physical Therapy Treatment  Patient Details  Name: Sara Clarke MRN: [id number] Date of Birth: 1990-10-20 Referring Provider:  Ernestina Penna, MD  Encounter Date: 08/29/2014      PT End of Session - 08/29/14 0954    Visit Number 6   Number of Visits 12   Date for PT Re-Evaluation 09/18/14   PT Start Time 0951   PT Stop Time 1032   PT Time Calculation (min) 41 min   Activity Tolerance Patient tolerated treatment well   Behavior During Therapy [facility] for tasks assessed/performed      Past Medical History  Diagnosis Date  . Asthma   . Hypothyroidism     Past Surgical History  Procedure Laterality Date  . Cesarean section      There were no vitals filed for this visit.  Visit Diagnosis:  Right ankle pain      Subjective Assessment - 08/29/14 0952    Subjective Had MRI and results said with she had a torn ligament and fluid around the ankle and is out for 6 more weeks. Requested no ankle isolator secondary to increased pain following use in last treatment. Reports that her ankle has been swelling more frequently recently with elevation and took a bath in epson salts last night but cannot notice difference   Limitations Walking   How long can you walk comfortably? 20 minutes+.   Currently in Pain? Yes   Pain Score 7    Pain Location Ankle   Pain Orientation Right;Medial;Lateral   Pain Descriptors / Indicators Aching   Pain Onset More than a month ago   Aggravating Factors  Walking   Pain Relieving Factors Ice            [facility] PT Assessment - 08/29/14 0001    Assessment   Medical Diagnosis Right ankle sprain.   Onset Date/Surgical Date 07/11/14                     [facility] Adult PT Treatment/Exercise - 08/29/14 0001    Modalities   Modalities Electrical Stimulation;Cryotherapy   Cryotherapy   Number Minutes  Cryotherapy 15 Minutes   Cryotherapy Location Ankle   Type of Cryotherapy Ice pack   Electrical Stimulation   Electrical Stimulation Location RT ankle   Electrical Stimulation Action IFC   Electrical Stimulation Parameters 1-10 Hz x15 min   Electrical Stimulation Goals Pain;Edema   Ankle Exercises: Aerobic   Stationary Bike x8 min   Ankle Exercises: Seated   ABC's 1 rep   BAPS Sitting;Level 2  x30 reps each direction   Other Seated Ankle Exercises 4 way ankle AROM x30 reps each   Ankle Exercises: Stretches   Slant Board Stretch 3 reps;30 seconds   Ankle Exercises: Standing   Rocker Board 3 minutes  for ankle ROM                     PT Long Term Goals - 08/29/14 1035    PT LONG TERM GOAL #1   Title Ind with advanced HEP.   Time 6   Period Weeks   Status Achieved   PT LONG TERM GOAL #2   Title 5/5 right ankle strength to increase stability for functional activites.   Time 6   Period Weeks   Status On-going   PT LONG TERM GOAL #3  Title Normal right nakle stability.   Time 6   Period Weeks   Status On-going   PT LONG TERM GOAL #4   Title Walk a normal daily distance for patient with right ankle pain not > 2/10.   Time 6   Period Weeks   Status On-going               Plan - 08/29/14 1017    Clinical Impression Statement Patient was 6 minutes late for treatment today. Patient did fairly well today but had complaints of pain during attempted heel raises in standing as well as complaint of cramping after 3 minutes on rockerboard. No further goals have been achieved secondary to decreased R ankle strength, stability and increased pain with ambulation. Normal modalites response noted following removal of the modalities. Experienced 5/10 pain following treatment.   Pt will benefit from skilled therapeutic intervention in order to improve on the following deficits Abnormal gait;Decreased activity tolerance;Pain   Rehab Potential Excellent   PT Frequency 3x  / week   PT Duration 6 weeks   PT Treatment/Interventions ADLs/Self Care Home Management;Cryotherapy;Academic librarianlectrical Stimulation;Moist Heat;Gait training;Therapeutic exercise;Neuromuscular re-education;Patient/family education;Manual techniques;Passive range of motion;Vasopneumatic Device   PT Next Visit Plan Continue per MPT POC.     Consulted and Agree with Plan of Care Patient        Problem List Patient Active Problem List   Diagnosis Date Noted  . Right ankle pain 07/17/2014  . Unspecified hypothyroidism 04/17/2012  . 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

## 2014-09-03 ENCOUNTER — Ambulatory Visit: Payer: BLUE CROSS/BLUE SHIELD | Admitting: Physical Therapy

## 2014-09-03 ENCOUNTER — Encounter: Payer: Self-pay | Admitting: Physical Therapy

## 2014-09-03 DIAGNOSIS — M25571 Pain in right ankle and joints of right foot: Secondary | ICD-10-CM

## 2014-09-03 NOTE — Therapy (Signed)
[facility] [address] [place], [place], [address] Phone: [phone]   Fax:  [phone]  Physical Therapy Treatment  Patient Details  Name: Sara Clarke MRN: [id number] Date of Birth: 07/12/1990 Referring Provider:  Ernestina PennaMoore, Donald W, MD  Encounter Date: 09/03/2014      PT End of Session - 09/03/14 0948    Visit Number 7   Number of Visits 12   Date for PT Re-Evaluation 09/18/14   PT Start Time 0947   PT Stop Time 1030   PT Time Calculation (min) 43 min   Activity Tolerance Patient tolerated treatment well   Behavior During Therapy [facility] for tasks assessed/performed      Past Medical History  Diagnosis Date  . Asthma   . Hypothyroidism     Past Surgical History  Procedure Laterality Date  . Cesarean section      There were no vitals filed for this visit.  Visit Diagnosis:  Right ankle pain      Subjective Assessment - 09/03/14 0947    Subjective Reports that ankle continues to ache. Reports that the R ankle hurts more laterally than medially. Reports that she does the band HEP given to her but it makes her ankle sore so she does not complete them as frequently. Reports that ankle continues to swell at times depending on amount of ambulation.   Limitations Walking   How long can you walk comfortably? 20 minutes+.   Currently in Pain? Yes   Pain Score 3    Pain Location Ankle   Pain Orientation Right;Medial;Lateral   Pain Descriptors / Indicators Aching;Sore   Pain Onset More than a month ago            [facility] - 09/03/14 0001    Assessment   Medical Diagnosis Right ankle sprain.   Onset Date/Surgical Date 07/11/14   Next MD Visit 09/06/2014                     [facility] Treatment/Exercise - 09/03/14 0001    Modalities   Modalities Electrical Stimulation;Vasopneumatic   Electrical Stimulation   Electrical Stimulation Location RT ankle   Electrical Stimulation Action IFC   Electrical Stimulation Parameters 1-10 Hz x15 min   Electrical Stimulation Goals Pain;Edema   Vasopneumatic   Number Minutes Vasopneumatic  15 minutes   Vasopnuematic Location  Ankle   Vasopneumatic Pressure Medium   Vasopneumatic Temperature  52   Ankle Exercises: Aerobic   Stationary Bike x8 min   Ankle Exercises: Seated   ABC's 1 rep   Ankle Circles/Pumps AROM;Right;Other reps (comment)  3x10 reps   BAPS Sitting;Level 2  x30 reps each   Other Seated Ankle Exercises 4 way ankle AROM x30 reps each   Ankle Exercises: Stretches   Slant Board Stretch 3 reps;30 seconds   Ankle Exercises: Standing   Rocker Board 3 minutes   for ROM             Balance Exercises - 09/03/14 1008    Balance Exercises: Standing   SLS Eyes open;Foam/compliant surface;Upper extremity support 2;Time  x2 min                PT Long Term Goals - 08/29/14 1035    PT LONG TERM GOAL #1   Title Ind with advanced HEP.   Time 6   Period Weeks   Status Achieved   PT LONG TERM GOAL #2   Title 5/5  right ankle strength to increase stability for functional activites.   Time 6   Period Weeks   Status On-going   PT LONG TERM GOAL #3   Title Normal right nakle stability.   Time 6   Period Weeks   Status On-going   PT LONG TERM GOAL #4   Title Walk a normal daily distance for patient with right ankle pain not > 2/10.   Time 6   Period Weeks   Status On-going               Plan - 09/03/14 1010    Clinical Impression Statement Patient tolerated treatment well today but reported cramping during rockerboard and SLS. No further goals were achieved today secondary to decreased R ankle strength, stability, and pain with ambulation. Normal modalities response observed following removal of the modalties. Experienced 1-2/10 pain following treatment.   Pt will benefit from skilled therapeutic intervention in order to improve on the following deficits Abnormal gait;Decreased activity  tolerance;Pain   Rehab Potential Excellent   PT Frequency 3x / week   PT Duration 6 weeks   PT Treatment/Interventions ADLs/Self Care Home Management;Cryotherapy;Academic librarian;Therapeutic exercise;Neuromuscular re-education;Patient/family education;Manual techniques;Passive range of motion;Vasopneumatic Device   PT Next Visit Plan Continue per MPT POC.     Consulted and Agree with Plan of Care Patient        Problem List Patient Active Problem List   Diagnosis Date Noted  . Right ankle pain 07/17/2014  . Unspecified hypothyroidism 04/17/2012  . 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 [place], [place], [address] Phone: [phone]   Fax:  [phone]

## 2014-09-05 ENCOUNTER — Encounter: Payer: Self-pay | Admitting: Physical Therapy

## 2014-09-05 ENCOUNTER — Ambulatory Visit: Payer: BLUE CROSS/BLUE SHIELD | Admitting: Physical Therapy

## 2014-09-05 DIAGNOSIS — M25571 Pain in right ankle and joints of right foot: Secondary | ICD-10-CM

## 2014-09-05 NOTE — Therapy (Signed)
[facility] [address] [place], [place], [address] Phone: [phone]   Fax:  [phone]  Physical Therapy Treatment  Patient Details  Name: Sara AxHeather L Ratti MRN: [id number] Date of Birth: 06/08/1990 Referring Provider:  Ernestina PennaMoore, Donald W, MD  Encounter Date: 09/05/2014      PT End of Session - 09/05/14 0953    Visit Number 8   Number of Visits 12   Date for PT Re-Evaluation 09/18/14   PT Start Time 0949   PT Stop Time 1035   PT Time Calculation (min) 46 min   Activity Tolerance Patient tolerated treatment well   Behavior During Therapy [facility] for tasks assessed/performed      Past Medical History  Diagnosis Date  . Asthma   . Hypothyroidism     Past Surgical History  Procedure Laterality Date  . Cesarean section      There were no vitals filed for this visit.  Visit Diagnosis:  Right ankle pain      Subjective Assessment - 09/05/14 0953    Subjective Reports ankle feeling achey this morning. States that ankle was a little swollen yesterday without walking a lot yesterday.   Limitations Walking   How long can you walk comfortably? 20 minutes+.   Currently in Pain? Yes   Pain Score 3    Pain Location Ankle   Pain Orientation Right;Medial;Lateral   Pain Descriptors / Indicators Aching   Pain Onset More than a month ago            [facility] - 09/05/14 0001    Assessment   Medical Diagnosis Right ankle sprain.   Onset Date/Surgical Date 07/11/14   Next MD Visit 09/06/2014                     [facility] - 09/05/14 0001    Modalities   Modalities Electrical Stimulation;Vasopneumatic   Electrical Stimulation   Electrical Stimulation Location RT ankle   Electrical Stimulation Action IFC   Electrical Stimulation Parameters 1-10 Hz x15 min   Electrical Stimulation Goals Pain;Edema   Vasopneumatic   Number Minutes Vasopneumatic  15 minutes   Vasopnuematic Location   Ankle   Vasopneumatic Pressure Medium   Vasopneumatic Temperature  34   Ankle Exercises: Aerobic   Stationary Bike x8 min   Ankle Exercises: Seated   ABC's 1 rep   Ankle Circles/Pumps AROM;Right;Other reps (comment)  x30 reps   Toe Raise 20 reps   BAPS Sitting;Level 2  x30 reps each direction   Other Seated Ankle Exercises 4 way ankle AROM x30 reps each   Ankle Exercises: Standing   Rocker Board --   Other Standing Ankle Exercises RLE SLS forward step up 4" step x30 reps   Ankle Exercises: Stretches   Gastroc Stretch 3 reps;30 seconds                     PT Long Term Goals - 08/29/14 1035    PT LONG TERM GOAL #1   Title Ind with advanced HEP.   Time 6   Period Weeks   Status Achieved   PT LONG TERM GOAL #2   Title 5/5 right ankle strength to increase stability for functional activites.   Time 6   Period Weeks   Status On-going   PT LONG TERM GOAL #3   Title Normal right nakle stability.   Time 6   Period Weeks   Status On-going  PT LONG TERM GOAL #4   Title Walk a normal daily distance for patient with right ankle pain not > 2/10.   Time 6   Period Weeks   Status On-going               Plan - 09/05/14 1024    Clinical Impression Statement Patient tolerated treatment well today. Had complaints of popping sensation in lateral R ankle during seated AROM inversion as well as tolerable posterior R ankle pain during seated B toe raises. Had no other complaints verbalized during exercises today. Normal modalities response observed following removal of the modalities. Denied pain following treatment just feeling the tingling following stimulation.   Pt will benefit from skilled therapeutic intervention in order to improve on the following deficits Abnormal gait;Decreased activity tolerance;Pain   Rehab Potential Excellent   PT Frequency 3x / week   PT Duration 6 weeks   PT Treatment/Interventions ADLs/Self Care Home Management;Cryotherapy;Corporate treasurer;Therapeutic exercise;Neuromuscular re-education;Patient/family education;Manual techniques;Passive range of motion;Vasopneumatic Device   PT Next Visit Plan Continue per MPT POC.     Consulted and Agree with Plan of Care Patient        Problem List Patient Active Problem List   Diagnosis Date Noted  . Right ankle pain 07/17/2014  . Unspecified hypothyroidism 04/17/2012  . 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 [place], [place], [address] Phone: [phone]   Fax:  [phone]

## 2014-09-06 ENCOUNTER — Ambulatory Visit: Payer: BLUE CROSS/BLUE SHIELD | Admitting: Family Medicine

## 2014-09-11 ENCOUNTER — Encounter: Payer: Self-pay | Admitting: Physical Therapy

## 2014-09-11 ENCOUNTER — Ambulatory Visit: Payer: BLUE CROSS/BLUE SHIELD | Admitting: Physical Therapy

## 2014-09-11 DIAGNOSIS — M25571 Pain in right ankle and joints of right foot: Secondary | ICD-10-CM

## 2014-09-11 NOTE — Therapy (Signed)
[facility] [address] [place], [place], [address] Phone: [phone]   Fax:  [phone]  Physical Therapy Treatment  Patient Details  Name: Sara Clarke MRN: [id number] Date of Birth: 22-Mar-1990 Referring Provider:  Ernestina Penna, MD  Encounter Date: 09/11/2014      PT End of Session - 09/11/14 1026    Visit Number 9   Number of Visits 12   Date for PT Re-Evaluation 09/18/14   PT Start Time 0950   PT Stop Time 1046   PT Time Calculation (min) 56 min   Activity Tolerance Patient tolerated treatment well   Behavior During Therapy [facility] for tasks assessed/performed      Past Medical History  Diagnosis Date  . Asthma   . Hypothyroidism     Past Surgical History  Procedure Laterality Date  . Cesarean section      There were no vitals filed for this visit.  Visit Diagnosis:  Right ankle pain      Subjective Assessment - 09/11/14 0953    Subjective Patient repoted ankle feeling achey   Limitations Walking   How long can you walk comfortably? 20 minutes+.   Currently in Pain? Yes   Pain Score 2    Pain Location Ankle   Pain Orientation Right;Medial;Lateral   Pain Descriptors / Indicators Aching   Pain Type Acute pain   Pain Onset More than a month ago   Pain Frequency Intermittent   Aggravating Factors  walking   Pain Relieving Factors rest and ice                         [facility] Adult PT Treatment/Exercise - 09/11/14 0001    Electrical Stimulation   Electrical Stimulation Location RT ankle   Electrical Stimulation Action IFC   Electrical Stimulation Parameters 1-10HZ    Electrical Stimulation Goals Pain;Edema   Vasopneumatic   Number Minutes Vasopneumatic  15 minutes   Vasopnuematic Location  Ankle   Vasopneumatic Pressure Medium   Ankle Exercises: Aerobic   Stationary Bike   Ankle Exercises: Standing   SLS on airex   Rocker Board 3 minutes   Rebounder 4x10 with 2#   Other  Standing Ankle Exercises step ups/side step 2x10 each   Ankle Exercises: Seated   Toe Raise 20 reps   Other Seated Ankle Exercises 4way red tband 2x10 each                     PT Long Term Goals - 08/29/14 1035    PT LONG TERM GOAL #1   Title Ind with advanced HEP.   Time 6   Period Weeks   Status Achieved   PT LONG TERM GOAL #2   Title 5/5 right ankle strength to increase stability for functional activites.   Time 6   Period Weeks   Status On-going   PT LONG TERM GOAL #3   Title Normal right nakle stability.   Time 6   Period Weeks   Status On-going   PT LONG TERM GOAL #4   Title Walk a normal daily distance for patient with right ankle pain not > 2/10.   Time 6   Period Weeks   Status On-going               Plan - 09/11/14 1027    Clinical Impression Statement Patient tolerated treatment very well today. Patient able to perform exercises with no pain  increase. Has progressed with proprioception activities. Unable to meet goals due to pain limitations. Patient reported up to 5/10 pain with   Pt will benefit from skilled therapeutic intervention in order to improve on the following deficits Abnormal gait;Decreased activity tolerance;Pain   Rehab Potential Excellent   PT Frequency 3x / week   PT Duration 6 weeks   PT Treatment/Interventions ADLs/Self Care Home Management;Cryotherapy;Academic librarian;Therapeutic exercise;Neuromuscular re-education;Patient/family education;Manual techniques;Passive range of motion;Vasopneumatic Device   PT Next Visit Plan Continue per MPT POC.  may try standing XTS for proprioception   Consulted and Agree with Plan of Care Patient        Problem List Patient Active Problem List   Diagnosis Date Noted  . Right ankle pain 07/17/2014  . Unspecified hypothyroidism 04/17/2012  . 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

## 2014-09-13 ENCOUNTER — Encounter: Payer: Self-pay | Admitting: Physical Therapy

## 2014-09-13 ENCOUNTER — Ambulatory Visit: Payer: BLUE CROSS/BLUE SHIELD | Admitting: Physical Therapy

## 2014-09-13 DIAGNOSIS — M25571 Pain in right ankle and joints of right foot: Secondary | ICD-10-CM

## 2014-09-13 NOTE — Therapy (Signed)
[facility] [address] [place], [place], [address] Phone: [phone]   Fax:  [phone]  Physical Therapy Treatment  Patient Details  Name: Sara Clarke MRN: [id number] Date of Birth: [birth year] Referring Provider:  Ernestina Penna, MD  Encounter Date: 09/13/2014      PT End of Session - 09/13/14 1029    Visit Number 10   Number of Visits 12   Date for PT Re-Evaluation 09/18/14   PT Start Time 0950   PT Stop Time 1045   PT Time Calculation (min) 55 min   Activity Tolerance Patient tolerated treatment well   Behavior During Therapy [facility] for tasks assessed/performed      Past Medical History  Diagnosis Date  . Asthma   . Hypothyroidism     Past Surgical History  Procedure Laterality Date  . Cesarean section      There were no vitals filed for this visit.  Visit Diagnosis:  Right ankle pain      Subjective Assessment - 09/13/14 0959    Subjective Patient repoted ankle feeling achey   Limitations Walking   How long can you walk comfortably? 20 minutes+.   Currently in Pain? Yes   Pain Score 2    Pain Location Ankle   Pain Orientation Right;Medial;Lateral   Pain Descriptors / Indicators Aching   Pain Type Acute pain   Pain Onset More than a month ago   Pain Frequency Intermittent   Aggravating Factors  walikng   Pain Relieving Factors rest and ice                         [facility] Adult PT Treatment/Exercise - 09/13/14 0001    Electrical Stimulation   Electrical Stimulation Location RT ankle   Electrical Stimulation Action IFC   Electrical Stimulation Parameters 1-10HZ    Electrical Stimulation Goals Pain;Edema   Vasopneumatic   Number Minutes Vasopneumatic  15 minutes   Vasopnuematic Location  Ankle   Vasopneumatic Pressure Medium   Ankle Exercises: Aerobic   Stationary Bike L2 x66min   Ankle Exercises: Standing   Rocker Board 3 minutes   Rebounder 4x10 with 2#   Other Standing Ankle  Exercises SLS latral/medial with PINK XTS for ankle proprioceptio x2 min each way   Other Standing Ankle Exercises step ups/side step 6" step 2x10 each   Ankle Exercises: Seated   Other Seated Ankle Exercises 4way red tband 2x10 each                     PT Long Term Goals - 08/29/14 1035    PT LONG TERM GOAL #1   Title Ind with advanced HEP.   Time 6   Period Weeks   Status Achieved   PT LONG TERM GOAL #2   Title 5/5 right ankle strength to increase stability for functional activites.   Time 6   Period Weeks   Status On-going   PT LONG TERM GOAL #3   Title Normal right nakle stability.   Time 6   Period Weeks   Status On-going   PT LONG TERM GOAL #4   Title Walk a normal daily distance for patient with right ankle pain not > 2/10.   Time 6   Period Weeks   Status On-going               Plan - 09/13/14 1030    Clinical Impression Statement Patient tolerated treatment well  today with no pain complaints. Progressed patient with proprioception exercises today with good stability. Goals ongoing due to pain deficits with increased walking or activity per patient.    Pt will benefit from skilled therapeutic intervention in order to improve on the following deficits Abnormal gait;Decreased activity tolerance;Pain   Rehab Potential Excellent   PT Frequency 3x / week   PT Duration 6 weeks   PT Treatment/Interventions ADLs/Self Care Home Management;Cryotherapy;Academic librarian;Therapeutic exercise;Neuromuscular re-education;Patient/family education;Manual techniques;Passive range of motion;Vasopneumatic Device   PT Next Visit Plan Continue per MPT POC.    Consulted and Agree with Plan of Care Patient        Problem List Patient Active Problem List   Diagnosis Date Noted  . Right ankle pain 07/17/2014  . Unspecified hypothyroidism 04/17/2012  . Reactive airways dysfunction syndrome 04/17/2012    Hermelinda Dellen,  PTA 09/13/2014, 10:59 AM  [facility] [address] [place], [place], [address] Phone: [phone]   Fax:  [phone]

## 2014-09-17 ENCOUNTER — Encounter: Payer: Self-pay | Admitting: Physical Therapy

## 2014-09-17 ENCOUNTER — Ambulatory Visit: Payer: BLUE CROSS/BLUE SHIELD | Attending: Family Medicine | Admitting: Physical Therapy

## 2014-09-17 DIAGNOSIS — M25571 Pain in right ankle and joints of right foot: Secondary | ICD-10-CM | POA: Diagnosis present

## 2014-09-17 NOTE — Therapy (Signed)
[facility] [address] [place], [place], [address] Phone: [phone]   Fax:  [phone]  Physical Therapy Treatment  Patient Details  Name: Sara Clarke MRN: [id number] Date of Birth: 02/02/1991 Referring Provider:  Ernestina Penna, MD  Encounter Date: 09/17/2014      PT End of Session - 09/17/14 6962    Visit Number 11   Number of Visits 12   Date for PT Re-Evaluation 09/18/14   PT Start Time 0818   PT Stop Time 0906   PT Time Calculation (min) 48 min   Activity Tolerance Patient tolerated treatment well   Behavior During Therapy [facility] for tasks assessed/performed      Past Medical History  Diagnosis Date  . Asthma   . Hypothyroidism     Past Surgical History  Procedure Laterality Date  . Cesarean section      There were no vitals filed for this visit.  Visit Diagnosis:  Right ankle pain      Subjective Assessment - 09/17/14 0827    Subjective Patient reported ankle feeling achey. Reports that she got in pool the other day trying to swim and had to stop. Also asked whether elliptical was an option at the gym where she goes when she doesn't have therapy and has been getting on stationary bike. States that she thinks inside of ankle is getting better but the top still feels weak,   Limitations Walking   How long can you walk comfortably? 20 minutes+.   Currently in Pain? Yes   Pain Score 3    Pain Location Ankle   Pain Orientation Right;Medial;Lateral   Pain Descriptors / Indicators Aching   Pain Type Acute pain   Pain Onset More than a month ago            [facility] PT Assessment - 09/17/14 0001    Assessment   Medical Diagnosis Right ankle sprain.   Onset Date/Surgical Date 07/11/14   Next MD Visit 10/09/2014                     [facility] Adult PT Treatment/Exercise - 09/17/14 0001    Modalities   Modalities Vasopneumatic   Vasopneumatic   Number Minutes Vasopneumatic  15 minutes   Vasopnuematic Location  Ankle   Vasopneumatic Pressure Medium   Vasopneumatic Temperature  68   Ankle Exercises: Aerobic   Stationary Bike L2 x30min   Ankle Exercises: Standing   Rocker Board 3 minutes   Rebounder 4x10 with 2# on airex   Other Standing Ankle Exercises SLS latral/medial with PINK XTS for ankle proprioceptio x2 min each way   Other Standing Ankle Exercises step ups/side step 6" step 2x10 each   Ankle Exercises: Seated   Other Seated Ankle Exercises 4D ankle strengthening red theraband 3x10 reps each                     PT Long Term Goals - 08/29/14 1035    PT LONG TERM GOAL #1   Title Ind with advanced HEP.   Time 6   Period Weeks   Status Achieved   PT LONG TERM GOAL #2   Title 5/5 right ankle strength to increase stability for functional activites.   Time 6   Period Weeks   Status On-going   PT LONG TERM GOAL #3   Title Normal right nakle stability.   Time 6   Period Weeks   Status On-going  PT LONG TERM GOAL #4   Title Walk a normal daily distance for patient with right ankle pain not > 2/10.   Time 6   Period Weeks   Status On-going               Plan - 09/17/14 0910    Clinical Impression Statement Patient tolerated treatment well today without complaint of increased pain. Experienced cramping sensation during SLS with pink XTS tension that was alleviated with a short rest break. Completed all exercises well with minimal verbal cueing for correction or technique. All goals remain on-going at this time secondary to decreased R ankle stability and ambulation pain. Normal vasopneumatic response following removal of the vasopneumatic system. Denied any pain or ache in R ankle following treatment.   Pt will benefit from skilled therapeutic intervention in order to improve on the following deficits Abnormal gait;Decreased activity tolerance;Pain   Rehab Potential Excellent   PT Frequency 3x / week   PT Duration 6 weeks   PT  Treatment/Interventions ADLs/Self Care Home Management;Cryotherapy;Academic librarian;Therapeutic exercise;Neuromuscular re-education;Patient/family education;Manual techniques;Passive range of motion;Vasopneumatic Device   PT Next Visit Plan Continue per MPT POC. Try elliptical to assess patient response and D/C.   Consulted and Agree with Plan of Care Patient        Problem List Patient Active Problem List   Diagnosis Date Noted  . Right ankle pain 07/17/2014  . Unspecified hypothyroidism 04/17/2012  . 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 [place], [place], [address] Phone: [phone]   Fax:  [phone]

## 2014-09-18 ENCOUNTER — Ambulatory Visit: Payer: BLUE CROSS/BLUE SHIELD | Admitting: *Deleted

## 2014-09-19 NOTE — Telephone Encounter (Signed)
finished

## 2014-09-20 ENCOUNTER — Encounter: Payer: Self-pay | Admitting: Physical Therapy

## 2014-09-20 ENCOUNTER — Ambulatory Visit: Payer: BLUE CROSS/BLUE SHIELD | Admitting: Physical Therapy

## 2014-09-20 DIAGNOSIS — M25571 Pain in right ankle and joints of right foot: Secondary | ICD-10-CM

## 2014-09-20 NOTE — Therapy (Signed)
[facility] [place], [place], [address] Phone: [phone]   Fax:  [phone]  Physical Therapy Treatment  Patient Details  Name: Sara Clarke MRN: [id number] Date of Birth: 1991-01-04 Referring Provider:  Chipper Herb, MD  Encounter Date: 09/20/2014      PT End of Session - 09/20/14 0859    Visit Number 12   Number of Visits 12   Date for PT Re-Evaluation 09/18/14   PT Start Time 3762   PT Stop Time 0955   PT Time Calculation (min) 58 min   Activity Tolerance Patient tolerated treatment well   Behavior During Therapy [facility] for tasks assessed/performed      Past Medical History  Diagnosis Date  . Asthma   . Hypothyroidism     Past Surgical History  Procedure Laterality Date  . Cesarean section      There were no vitals filed for this visit.  Visit Diagnosis:  Right ankle pain      Subjective Assessment - 09/20/14 0859    Subjective Reports that ankle feels achey today. States that sometimes it feels like something is sticking her in the R ankle. States that she had gotten to where she didn't have medial ankle pain but in the last few days has had intermittant sharp, quick stabbing pain in R medial and lasts for up to 5 minutes.   Limitations Walking   How long can you walk comfortably? 20 minutes+.   Currently in Pain? Yes   Pain Score 4    Pain Location Ankle   Pain Orientation Right;Medial;Lateral   Pain Descriptors / Indicators Aching   Pain Type Acute pain   Pain Onset More than a month ago            [facility] PT Assessment - 09/20/14 0001    Assessment   Medical Diagnosis Right ankle sprain.   Onset Date/Surgical Date 07/11/14   Next MD Visit 10/09/2014   ROM / Strength   AROM / PROM / Strength Strength   Strength   Overall Strength Within functional limits for tasks performed   Strength Assessment Site Ankle   Right/Left Ankle Right   Right Ankle Dorsiflexion 5/5   Right Ankle  Plantar Flexion 5/5   Right Ankle Inversion 5/5   Right Ankle Eversion 5/5                     [facility] Adult PT Treatment/Exercise - 09/20/14 0001    Modalities   Modalities Electrical Stimulation;Vasopneumatic   Electrical Stimulation   Electrical Stimulation Location R ankle   Electrical Stimulation Action IFC    Electrical Stimulation Parameters 1-10 Hz x15 min with elevation   Electrical Stimulation Goals Pain;Edema   Vasopneumatic   Number Minutes Vasopneumatic  15 minutes   Vasopnuematic Location  Ankle   Vasopneumatic Pressure Medium   Vasopneumatic Temperature  47   Ankle Exercises: Aerobic   Stationary Bike x10 min   Elliptical L2, R2 x5 min   Ankle Exercises: Standing   Rocker Board 3 minutes   Toe Raise 20 reps   Other Standing Ankle Exercises step ups/side step 6" step 2x10 each             Balance Exercises - 09/20/14 0922    Balance Exercises: Standing   Rebounder Single leg;Static;Other reps (comment)  3x10 reps   Other Standing Exercises DLS on inverted BOSU no UE use x3 min, RLE SLS medial, lateral with Pink  XTS x2 min each                PT Long Term Goals - 09/20/14 7225    PT LONG TERM GOAL #1   Title Ind with advanced HEP.   Time 6   Period Weeks   Status Achieved   PT LONG TERM GOAL #2   Title 5/5 right ankle strength to increase stability for functional activites.   Time 6   Period Weeks   Status Achieved   PT LONG TERM GOAL #3   Title Normal right nakle stability.   Time 6   Period Weeks   Status On-going   PT LONG TERM GOAL #4   Title Walk a normal daily distance for patient with right ankle pain not > 2/10.   Time 6   Period Weeks   Status Not Met               Plan - 09/20/14 0940    Clinical Impression Statement Patient has progressed in PT regarding strength and proprioception. Tolerated elliptical at L2, R2 well reporting fatigue and had a little pain towards the end of the 5 minutes. R ankle  strength was measured as 5/5 throughout. Demonstrates good R ankle stability during SLS propriocpetion exercises with normal ankle sway. Continues to have increased swelling with ambulation and activitiy and intermittant pain. Also continues to report that the laterosuperior area of the R ankle feels loose and continues to have mild tenderness over the lateral malleoli of the R ankle. Normal modalities respones noted following removal of the modalities. Denied pain following treatment  and patient was educated to start for short intervals and start flat regarding ramp and low regarding resistance.   Pt will benefit from skilled therapeutic intervention in order to improve on the following deficits Abnormal gait;Decreased activity tolerance;Pain   Rehab Potential Excellent   PT Frequency 3x / week   PT Duration 6 weeks   PT Treatment/Interventions ADLs/Self Care Home Management;Cryotherapy;Software engineer;Therapeutic exercise;Neuromuscular re-education;Patient/family education;Manual techniques;Passive range of motion;Vasopneumatic Device   PT Next Visit Plan Communicate to MPT of need for D/C summary.   Consulted and Agree with Plan of Care Patient        Problem List Patient Active Problem List   Diagnosis Date Noted  . Right ankle pain 07/17/2014  . Unspecified hypothyroidism 04/17/2012  . 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

## 2014-09-23 NOTE — Therapy (Addendum)
[facility] [place], [place], [address] Phone: [phone]   Fax:  [phone]  Physical Therapy Treatment  Patient Details  Name: Sara Clarke MRN: [id number] Date of Birth: 08-May-1990 Referring Provider:  Chipper Herb, MD  Encounter Date: 09/20/2014    Past Medical History  Diagnosis Date  . Asthma   . Hypothyroidism     Past Surgical History  Procedure Laterality Date  . Cesarean section      There were no vitals filed for this visit.  Visit Diagnosis:  Right ankle pain                                    PT Long Term Goals - 09/20/14 4287    PT LONG TERM GOAL #1   Title Ind with advanced HEP.   Time 6   Period Weeks   Status Achieved   PT LONG TERM GOAL #2   Title 5/5 right ankle strength to increase stability for functional activites.   Time 6   Period Weeks   Status Achieved   PT LONG TERM GOAL #3   Title Normal right nakle stability.   Time 6   Period Weeks   Status On-going   PT LONG TERM GOAL #4   Title Walk a normal daily distance for patient with right ankle pain not > 2/10.   Time 6   Period Weeks   Status Not Met               Problem List Patient Active Problem List   Diagnosis Date Noted  . Right ankle pain 07/17/2014  . Unspecified hypothyroidism 04/17/2012  . 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   Remaining deficits: Goals met.   Education / Equipment: HEP. Plan: Patient agrees to discharge.  Patient goals were met. Patient is being discharged due to meeting the stated rehab goals.  ?????       Armand Preast, [place] MPT 09/23/2014, 11:01 AM  [facility] [address] [place], [place], [address] Phone: [phone]   Fax:   [phone]

## 2014-10-09 ENCOUNTER — Encounter: Payer: Self-pay | Admitting: Family Medicine

## 2014-10-09 ENCOUNTER — Encounter (INDEPENDENT_AMBULATORY_CARE_PROVIDER_SITE_OTHER): Payer: Self-pay

## 2014-10-09 ENCOUNTER — Ambulatory Visit (INDEPENDENT_AMBULATORY_CARE_PROVIDER_SITE_OTHER): Payer: BLUE CROSS/BLUE SHIELD | Admitting: Family Medicine

## 2014-10-09 VITALS — BP 116/81 | HR 90 | Ht 62.0 in | Wt 176.0 lb

## 2014-10-09 DIAGNOSIS — M25571 Pain in right ankle and joints of right foot: Secondary | ICD-10-CM | POA: Diagnosis not present

## 2014-10-09 NOTE — Patient Instructions (Signed)
Continue brace, home exercises as you have been. We will go ahead with referral to a foot/ankle specialist to discuss surgical intervention, risks/benefits, time table. Otherwise follow up with me in 6 weeks.

## 2014-10-15 NOTE — Assessment & Plan Note (Signed)
radiographs negative.  MRI showed partial ATFL tear.  Still clinically with instability despite PT, ASO Reassured.  Despite PT, ASO over past 3 months.  Would still expect her to recover without needing surgery though discussed if she continues to have current symptoms despite treatment would consider referral to foot/ankle specialist at f/u in 6 weeks.

## 2014-10-15 NOTE — Progress Notes (Signed)
PCP: Rudi Heap, MD  Subjective:   HPI: Patient is a 24 y.o. female here for ankle injury.  5/27: Patient reports on 5/25 she accidentally fell down 2 steps, landed with foot underneath her. Difficulty bearing weight on right foot since then. Inverted ankle in the fall. No prior injuries. Swelling but no bruising. Given ASO though painful in this with the pressure. Taking tylenol. Walking on ball of foot.  6/10: Patient reports she feels about the same. Doing HEP, wearing boot. + swelling. Pain lateral and medial.  7/7: Patient again reports persistent pain at 5/10 level, up to 7/10 when walking. Doing physical therapy and wearing ASO. + swelling.  8/23: Patient reports she's only a little better - pain level 4/10. Done with PT for about 3 weeks now, doing home exercises and riding bike. Using ASO when walking outside the house. Still feels unstable. + swelling if walking a long time.  Past Medical History  Diagnosis Date  . Asthma   . Hypothyroidism     Current Outpatient Prescriptions on File Prior to Visit  Medication Sig Dispense Refill  . albuterol (PROVENTIL HFA;VENTOLIN HFA) 108 (90 BASE) MCG/ACT inhaler Inhale 2 puffs into the lungs every 6 (six) hours as needed for wheezing.    . beclomethasone (QVAR) 40 MCG/ACT inhaler Inhale 2 puffs into the lungs 2 (two) times daily.    . ciprofloxacin (CIPRO) 500 MG tablet Take 1 tablet (500 mg total) by mouth 2 (two) times daily. (Patient not taking: Reported on 08/07/2014) 20 tablet 0  . fluconazole (DIFLUCAN) 150 MG tablet Take 1 tablet (150 mg total) by mouth once. (Patient not taking: Reported on 08/07/2014) 2 tablet 1  . HYDROcodone-homatropine (HYCODAN) 5-1.5 MG/5ML syrup Take 5 mLs by mouth every 8 (eight) hours as needed for cough. (Patient not taking: Reported on 08/07/2014) 120 mL 0  . meclizine (ANTIVERT) 25 MG tablet Take 1 tablet (25 mg total) by mouth 3 (three) times daily as needed for dizziness. (Patient  not taking: Reported on 08/07/2014) 30 tablet 0   No current facility-administered medications on file prior to visit.    Past Surgical History  Procedure Laterality Date  . Cesarean section      No Known Allergies  Social History   Social History  . Marital Status: Married    Spouse Name: N/A  . Number of Children: N/A  . Years of Education: N/A   Occupational History  . Not on file.   Social History Main Topics  . Smoking status: Never Smoker   . Smokeless tobacco: Not on file  . Alcohol Use: No  . Drug Use: No  . Sexual Activity: Yes    Birth Control/ Protection: None   Other Topics Concern  . Not on file   Social History Narrative    Family History  Problem Relation Age of Onset  . Thyroid disease Mother   . Hypertension Mother   . Hypertension Father     BP 116/81 mmHg  Pulse 90  Ht 5\' 2"  (1.575 m)  Wt 176 lb (79.833 kg)  BMI 32.18 kg/m2  Review of Systems: See HPI above.    Objective:  Physical Exam:  Gen: NAD  Right ankle: Mild lateral swelling.  No bruising, other deformity. Mild limitation ROM all directions. TTP over ATFL, ant ankle joint.  No focal bony tenderness. 1+ ant drawer, negative talar tilt.  Negative syndesmotic compression. Thompsons test negative. NV intact distally.    Assessment & Plan:  1. Right  ankle injury - radiographs negative.  MRI showed partial ATFL tear.  Still clinically with instability despite PT, ASO Reassured.  Despite PT, ASO over past 3 months.  Would still expect her to recover without needing surgery though discussed if she continues to have current symptoms despite treatment would consider referral to foot/ankle specialist at f/u in 6 weeks.

## 2014-11-08 ENCOUNTER — Ambulatory Visit (INDEPENDENT_AMBULATORY_CARE_PROVIDER_SITE_OTHER): Payer: BLUE CROSS/BLUE SHIELD | Admitting: Physician Assistant

## 2014-11-08 ENCOUNTER — Encounter: Payer: Self-pay | Admitting: Physician Assistant

## 2014-11-08 VITALS — BP 125/83 | HR 82 | Temp 98.6°F | Ht 62.0 in | Wt 182.0 lb

## 2014-11-08 DIAGNOSIS — J309 Allergic rhinitis, unspecified: Secondary | ICD-10-CM

## 2014-11-08 DIAGNOSIS — J029 Acute pharyngitis, unspecified: Secondary | ICD-10-CM | POA: Diagnosis not present

## 2014-11-08 DIAGNOSIS — N3090 Cystitis, unspecified without hematuria: Secondary | ICD-10-CM

## 2014-11-08 DIAGNOSIS — R399 Unspecified symptoms and signs involving the genitourinary system: Secondary | ICD-10-CM | POA: Diagnosis not present

## 2014-11-08 LAB — POCT UA - MICROSCOPIC ONLY
Casts, Ur, LPF, POC: NEGATIVE
Crystals, Ur, HPF, POC: NEGATIVE
MUCUS UA: NEGATIVE
Yeast, UA: NEGATIVE

## 2014-11-08 LAB — POCT URINALYSIS DIPSTICK
BILIRUBIN UA: NEGATIVE
Glucose, UA: NEGATIVE
KETONES UA: NEGATIVE
Nitrite, UA: NEGATIVE
Protein, UA: NEGATIVE
Spec Grav, UA: 1.01
Urobilinogen, UA: NEGATIVE
pH, UA: 7

## 2014-11-08 LAB — POCT RAPID STREP A (OFFICE): Rapid Strep A Screen: NEGATIVE

## 2014-11-08 MED ORDER — NITROFURANTOIN MONOHYD MACRO 100 MG PO CAPS: 100.0000 mg | ORAL_CAPSULE | Freq: Two times a day (BID) | ORAL | Status: DC

## 2014-11-08 MED ORDER — FLUTICASONE PROPIONATE 50 MCG/ACT NA SUSP: 2.0000 | Freq: Every day | NASAL | Status: DC

## 2014-11-08 MED ORDER — LORATADINE 10 MG PO TABS: 10.0000 mg | ORAL_TABLET | Freq: Every day | ORAL | Status: DC

## 2014-11-08 NOTE — Patient Instructions (Signed)
Urinary Tract Infection Urinary tract infections (UTIs) can develop anywhere along your urinary tract. Your urinary tract is your body's drainage system for removing wastes and extra water. Your urinary tract includes two kidneys, two ureters, a bladder, and a urethra. Your kidneys are a pair of bean-shaped organs. Each kidney is about the size of your fist. They are located below your ribs, one on each side of your spine. CAUSES Infections are caused by microbes, which are microscopic organisms, including fungi, viruses, and bacteria. These organisms are so small that they can only be seen through a microscope. Bacteria are the microbes that most commonly cause UTIs. SYMPTOMS  Symptoms of UTIs may vary by age and gender of the patient and by the location of the infection. Symptoms in young women typically include a frequent and intense urge to urinate and a painful, burning feeling in the bladder or urethra during urination. Older women and men are more likely to be tired, shaky, and weak and have muscle aches and abdominal pain. A fever may mean the infection is in your kidneys. Other symptoms of a kidney infection include pain in your back or sides below the ribs, nausea, and vomiting. DIAGNOSIS To diagnose a UTI, your caregiver will ask you about your symptoms. Your caregiver also will ask to provide a urine sample. The urine sample will be tested for bacteria and white blood cells. White blood cells are made by your body to help fight infection. TREATMENT  Typically, UTIs can be treated with medication. Because most UTIs are caused by a bacterial infection, they usually can be treated with the use of antibiotics. The choice of antibiotic and length of treatment depend on your symptoms and the type of bacteria causing your infection. HOME CARE INSTRUCTIONS  If you were prescribed antibiotics, take them exactly as your caregiver instructs you. Finish the medication even if you feel better after you  have only taken some of the medication.  Drink enough water and fluids to keep your urine clear or pale yellow.  Avoid caffeine, tea, and carbonated beverages. They tend to irritate your bladder.  Empty your bladder often. Avoid holding urine for long periods of time.  Empty your bladder before and after sexual intercourse.  After a bowel movement, women should cleanse from front to back. Use each tissue only once. SEEK MEDICAL CARE IF:   You have back pain.  You develop a fever.  Your symptoms do not begin to resolve within 3 days. SEEK IMMEDIATE MEDICAL CARE IF:   You have severe back pain or lower abdominal pain.  You develop chills.  You have nausea or vomiting.  You have continued burning or discomfort with urination. MAKE SURE YOU:   Understand these instructions.  Will watch your condition.  Will get help right away if you are not doing well or get worse. Document Released: 11/12/2004 Document Revised: 08/04/2011 Document Reviewed: 03/13/2011 [facility] Patient Information 2015 [place], [place]. This information is not intended to replace advice given to you by your health care provider. Make sure you discuss any questions you have with your health care provider. Allergic Rhinitis Allergic rhinitis is when the mucous membranes in the nose respond to allergens. Allergens are particles in the air that cause your body to have an allergic reaction. This causes you to release allergic antibodies. Through a chain of events, these eventually cause you to release histamine into the blood stream. Although meant to protect the body, it is this release of histamine that causes  your discomfort, such as frequent sneezing, congestion, and an itchy, runny nose.  CAUSES  Seasonal allergic rhinitis (hay fever) is caused by pollen allergens that may come from grasses, trees, and weeds. Year-round allergic rhinitis (perennial allergic rhinitis) is caused by allergens such as house dust mites,  pet dander, and mold spores.  SYMPTOMS   Nasal stuffiness (congestion).  Itchy, runny nose with sneezing and tearing of the eyes. DIAGNOSIS  Your health care provider can help you determine the allergen or allergens that trigger your symptoms. If you and your health care provider are unable to determine the allergen, skin or blood testing may be used. TREATMENT  Allergic rhinitis does not have a cure, but it can be controlled by:  Medicines and allergy shots (immunotherapy).  Avoiding the allergen. Hay fever may often be treated with antihistamines in pill or nasal spray forms. Antihistamines block the effects of histamine. There are over-the-counter medicines that may help with nasal congestion and swelling around the eyes. Check with your health care provider before taking or giving this medicine.  If avoiding the allergen or the medicine prescribed do not work, there are many new medicines your health care provider can prescribe. Stronger medicine may be used if initial measures are ineffective. Desensitizing injections can be used if medicine and avoidance does not work. Desensitization is when a patient is given ongoing shots until the body becomes less sensitive to the allergen. Make sure you follow up with your health care provider if problems continue. HOME CARE INSTRUCTIONS It is not possible to completely avoid allergens, but you can reduce your symptoms by taking steps to limit your exposure to them. It helps to know exactly what you are allergic to so that you can avoid your specific triggers. SEEK MEDICAL CARE IF:   You have a fever.  You develop a cough that does not stop easily (persistent).  You have shortness of breath.  You start wheezing.  Symptoms interfere with normal daily activities. Document Released: 10/28/2000 Document Revised: 02/07/2013 Document Reviewed: 10/10/2012 [facility] Patient Information 2015 [place], [place]. This information is not intended to replace  advice given to you by your health care provider. Make sure you discuss any questions you have with your health care provider.

## 2014-11-08 NOTE — Progress Notes (Signed)
Subjective:    Patient ID: Sara Clarke, female    DOB: 1990-10-21, 24 y.o.   MRN: [id number]  HPI 24 y/o female presents with c/o "stopped up head" with drainage, sore throat x 1 week.   She also has c/o frequent urination with small amts out urine each time.   Has tried tylenol for sinuses with no relief.     Review of Systems  Constitutional: Negative.   HENT: Positive for congestion (nasal ), ear pain (bilateral ), postnasal drip, rhinorrhea, sneezing and sore throat.   Eyes: Negative.   Respiratory: Positive for cough (when lying down ).   Cardiovascular: Negative.   Gastrointestinal: Positive for abdominal pain (lower).  Endocrine: Positive for polyuria.  Genitourinary: Positive for urgency, frequency and decreased urine volume (small amounts when urinating ). Negative for dysuria.  Musculoskeletal: Negative.   Skin: Negative.   Neurological: Negative.   Psychiatric/Behavioral: Negative.        Objective:   Physical Exam  Constitutional: She is oriented to person, place, and time. She appears well-developed and well-nourished. No distress.  HENT:  Head: Normocephalic.  Right Ear: External ear normal.  Left Ear: External ear normal.  Mouth/Throat: No oropharyngeal exudate.  Posterior pharynx erythema with bilateral tonsillar hypertropphy   Cardiovascular: Normal rate.  Exam reveals no gallop and no friction rub.   No murmur heard. Pulmonary/Chest: Effort normal and breath sounds normal. No respiratory distress. She has no wheezes. She has no rales. She exhibits no tenderness.  Abdominal: There is tenderness (suprapubic ).  Neurological: She is alert and oriented to person, place, and time.  Skin: She is not diaphoretic.  Psychiatric: She has a normal mood and affect. Her behavior is normal. Judgment and thought content normal.  Nursing note and vitals reviewed.   Results for orders placed or performed in visit on 11/08/14  POCT rapid strep A  Result Value Ref  Range   Rapid Strep A Screen Negative Negative  POCT UA - Microscopic Only  Result Value Ref Range   WBC, Ur, HPF, POC 20-30    RBC, urine, microscopic 5-7    Bacteria, U Microscopic rare    Mucus, UA neg    Epithelial cells, urine per micros moderate    Crystals, Ur, HPF, POC neg    Casts, Ur, LPF, POC neg    Yeast, UA neg   POCT urinalysis dipstick  Result Value Ref Range   Color, UA yellow    Clarity, UA cloudy    Glucose, UA neg    Bilirubin, UA neg    Ketones, UA neg    Spec Grav, UA 1.010    Blood, UA moderate    pH, UA 7.0    Protein, UA neg    Urobilinogen, UA negative    Nitrite, UA neg    Leukocytes, UA moderate (2+) (A) Negative         Assessment & Plan:  1. Sore throat  - POCT rapid strep A - loratadine (CLARITIN) 10 MG tablet; Take 1 tablet (10 mg total) by mouth daily.  Dispense: 30 tablet; Refill: 11 - fluticasone (FLONASE) 50 MCG/ACT nasal spray; Place 2 sprays into both nostrils daily.  Dispense: 16 g; Refill: 6  2. UTI symptoms  - POCT UA - Microscopic Only - POCT urinalysis dipstick - Urine culture - nitrofurantoin, macrocrystal-monohydrate, (MACROBID) 100 MG capsule; Take 1 capsule (100 mg total) by mouth 2 (two) times daily.  Dispense: 20 capsule; Refill: 0  3. Cystitis  -  nitrofurantoin, macrocrystal-monohydrate, (MACROBID) 100 MG capsule; Take 1 capsule (100 mg total) by mouth 2 (two) times daily.  Dispense: 20 capsule; Refill: 0  4. Allergic rhinitis, unspecified allergic rhinitis type  - loratadine (CLARITIN) 10 MG tablet; Take 1 tablet (10 mg total) by mouth daily.  Dispense: 30 tablet; Refill: 11 - fluticasone (FLONASE) 50 MCG/ACT nasal spray; Place 2 sprays into both nostrils daily.  Dispense: 16 g; Refill: 6  Drink lots of water.  F/U if no improvement   Tiffany A. Chauncey Reading PA-C

## 2014-11-10 LAB — URINE CULTURE

## 2014-11-21 ENCOUNTER — Ambulatory Visit: Payer: BLUE CROSS/BLUE SHIELD | Admitting: Family Medicine

## 2014-11-27 ENCOUNTER — Encounter: Payer: Self-pay | Admitting: Family Medicine

## 2014-11-27 ENCOUNTER — Ambulatory Visit (INDEPENDENT_AMBULATORY_CARE_PROVIDER_SITE_OTHER): Payer: BLUE CROSS/BLUE SHIELD | Admitting: Family Medicine

## 2014-11-27 VITALS — BP 118/83 | HR 76 | Ht 62.0 in | Wt 180.0 lb

## 2014-11-27 DIAGNOSIS — M25571 Pain in right ankle and joints of right foot: Secondary | ICD-10-CM

## 2014-11-27 NOTE — Assessment & Plan Note (Signed)
radiographs negative.  MRI showed partial ATFL tear.  Still clinically with instability despite PT, ASO.  Will go ahead with short leg cast today for total of 4 weeks - f/u in 2-4 weeks (likely 2 weeks to remove and replace cast).  Consider injection if still not improving.  If not improving at 6 months will make appointment back with Dr. Roda Shutters.  Continue with current work restrictions - letter provided.

## 2014-11-27 NOTE — Patient Instructions (Signed)
Elevate leg still as much as possible. Out of work still in meantime. Follow up with me in 4 weeks (if cast is bothering you, it gets wet, or any other problems see me sooner).

## 2014-11-27 NOTE — Progress Notes (Signed)
PCP: Rudi Heap, MD  Subjective:   HPI: Patient is a 24 y.o. female here for ankle injury.  5/27: Patient reports on 5/25 she accidentally fell down 2 steps, landed with foot underneath her. Difficulty bearing weight on right foot since then. Inverted ankle in the fall. No prior injuries. Swelling but no bruising. Given ASO though painful in this with the pressure. Taking tylenol. Walking on ball of foot.  6/10: Patient reports she feels about the same. Doing HEP, wearing boot. + swelling. Pain lateral and medial.  7/7: Patient again reports persistent pain at 5/10 level, up to 7/10 when walking. Doing physical therapy and wearing ASO. + swelling.  8/23: Patient reports she's only a little better - pain level 4/10. Done with PT for about 3 weeks now, doing home exercises and riding bike. Using ASO when walking outside the house. Still feels unstable. + swelling if walking a long time.  10/11: Patient returns with 3/10 level of pain lateral ankle. Using ASO, doing home exercises. Pain is sharp and sore. Gets cramp in foot at times. Saw Dr. Roda Shutters - recommended trying cast and/or injection - she would like to do cast - did not have someone to drive her home at that visit though. He also recommended minimum 2 more weeks conservative treatment before considering surgery. No skin changes, fever, other complaints.  Past Medical History  Diagnosis Date  . Asthma   . Hypothyroidism     Current Outpatient Prescriptions on File Prior to Visit  Medication Sig Dispense Refill  . fluticasone (FLONASE) 50 MCG/ACT nasal spray Place 2 sprays into both nostrils daily. 16 g 6  . loratadine (CLARITIN) 10 MG tablet Take 1 tablet (10 mg total) by mouth daily. 30 tablet 11  . nitrofurantoin, macrocrystal-monohydrate, (MACROBID) 100 MG capsule Take 1 capsule (100 mg total) by mouth 2 (two) times daily. 20 capsule 0   No current facility-administered medications on file prior to visit.     Past Surgical History  Procedure Laterality Date  . Cesarean section      No Known Allergies  Social History   Social History  . Marital Status: Married    Spouse Name: N/A  . Number of Children: N/A  . Years of Education: N/A   Occupational History  . Not on file.   Social History Main Topics  . Smoking status: Never Smoker   . Smokeless tobacco: Not on file  . Alcohol Use: No  . Drug Use: No  . Sexual Activity: Yes    Birth Control/ Protection: None   Other Topics Concern  . Not on file   Social History Narrative    Family History  Problem Relation Age of Onset  . Thyroid disease Mother   . Hypertension Mother   . Hypertension Father     BP 118/83 mmHg  Pulse 76  Ht  (1.575 m)  Wt 180 lb (81.647 kg)  BMI 32.91 kg/m2  Review of Systems: See HPI above.    Objective:  Physical Exam:  Gen: NAD  Right ankle: Mild lateral swelling.  No bruising, other deformity. Mild limitation ROM all directions. TTP over ATFL, ant ankle joint.  No focal bony tenderness. 1+ ant drawer, negative talar tilt.  Negative syndesmotic compression. Thompsons test negative. NV intact distally.    Left ankle: FROM without pain.  Assessment & Plan:  1. Right ankle injury - radiographs negative.  MRI showed partial ATFL tear.  Still clinically with instability despite PT, ASO.  Will go ahead with short leg cast today for total of 4 weeks - f/u in 2-4 weeks (likely 2 weeks to remove and replace cast).  Consider injection if still not improving.  If not improving at 6 months will make appointment back with Dr. Roda Shutters.  Continue with current work restrictions - letter provided.

## 2014-12-11 ENCOUNTER — Encounter (INDEPENDENT_AMBULATORY_CARE_PROVIDER_SITE_OTHER): Payer: Self-pay

## 2014-12-11 ENCOUNTER — Encounter: Payer: Self-pay | Admitting: Family Medicine

## 2014-12-11 ENCOUNTER — Ambulatory Visit (INDEPENDENT_AMBULATORY_CARE_PROVIDER_SITE_OTHER): Payer: BLUE CROSS/BLUE SHIELD | Admitting: Family Medicine

## 2014-12-11 VITALS — BP 122/81 | HR 72 | Ht 62.0 in | Wt 170.0 lb

## 2014-12-11 DIAGNOSIS — M25571 Pain in right ankle and joints of right foot: Secondary | ICD-10-CM

## 2014-12-12 NOTE — Progress Notes (Signed)
PCP: Rudi HeapMOORE, DONALD, MD  Subjective:   HPI: Patient is a 24 y.o. female here for ankle injury.  5/27: Patient reports on 5/25 she accidentally fell down 2 steps, landed with foot underneath her. Difficulty bearing weight on right foot since then. Inverted ankle in the fall. No prior injuries. Swelling but no bruising. Given ASO though painful in this with the pressure. Taking tylenol. Walking on ball of foot.  6/10: Patient reports she feels about the same. Doing HEP, wearing boot. + swelling. Pain lateral and medial.  7/7: Patient again reports persistent pain at 5/10 level, up to 7/10 when walking. Doing physical therapy and wearing ASO. + swelling.  8/23: Patient reports she's only a little better - pain level 4/10. Done with PT for about 3 weeks now, doing home exercises and riding bike. Using ASO when walking outside the house. Still feels unstable. + swelling if walking a long time.  10/11: Patient returns with 3/10 level of pain lateral ankle. Using ASO, doing home exercises. Pain is sharp and sore. Gets cramp in foot at times. Saw Dr. Roda ShuttersXu - recommended trying cast and/or injection - she would like to do cast - did not have someone to drive her home at that visit though. He also recommended minimum 2 more weeks conservative treatment before considering surgery. No skin changes, fever, other complaints.  10/25: Patient returns for a new cast. Pain level 0/10 lateral ankle. No swelling, bruising. No skin changes, fever, other complaints. Cast has worn at the heel.  Past Medical History  Diagnosis Date  . Asthma   . Hypothyroidism     Current Outpatient Prescriptions on File Prior to Visit  Medication Sig Dispense Refill  . fluticasone (FLONASE) 50 MCG/ACT nasal spray Place 2 sprays into both nostrils daily. 16 g 6  . loratadine (CLARITIN) 10 MG tablet Take 1 tablet (10 mg total) by mouth daily. 30 tablet 11  . nitrofurantoin, macrocrystal-monohydrate,  (MACROBID) 100 MG capsule Take 1 capsule (100 mg total) by mouth 2 (two) times daily. 20 capsule 0   No current facility-administered medications on file prior to visit.    Past Surgical History  Procedure Laterality Date  . Cesarean section      No Known Allergies  Social History   Social History  . Marital Status: Married    Spouse Name: N/A  . Number of Children: N/A  . Years of Education: N/A   Occupational History  . Not on file.   Social History Main Topics  . Smoking status: Never Smoker   . Smokeless tobacco: Not on file  . Alcohol Use: No  . Drug Use: No  . Sexual Activity: Yes    Birth Control/ Protection: None   Other Topics Concern  . Not on file   Social History Narrative    Family History  Problem Relation Age of Onset  . Thyroid disease Mother   . Hypertension Mother   . Hypertension Father     BP 122/81 mmHg  Pulse 72  Ht 5\' 2"  (1.575 m)  Wt 170 lb (77.111 kg)  BMI 31.09 kg/m2  Review of Systems: See HPI above.    Objective:  Physical Exam:  Gen: NAD  Right ankle: Cast removed Mild lateral swelling.  No bruising, other deformity. Mild limitation ROM all directions. TTP over ATFL, ant ankle joint.  No focal bony tenderness. 1+ ant drawer, negative talar tilt.  Negative syndesmotic compression. Thompsons test negative. NV intact distally.    Left ankle:  FROM without pain.  Assessment & Plan:  1. Right ankle injury - radiographs negative.  MRI showed partial ATFL tear.  Still clinically with instability despite PT, ASO.  New short leg cast today.  F/u in 2 weeks.  Consider injection if still not improving.  If not improving at 6 months will make appointment back with Dr. Roda Shutters.  Continue with current work restrictions - letter provided.

## 2014-12-12 NOTE — Assessment & Plan Note (Signed)
radiographs negative.  MRI showed partial ATFL tear.  Still clinically with instability despite PT, ASO.  New short leg cast today.  F/u in 2 weeks.  Consider injection if still not improving.  If not improving at 6 months will make appointment back with Dr. Roda ShuttersXu.  Continue with current work restrictions - letter provided.

## 2014-12-25 ENCOUNTER — Ambulatory Visit (INDEPENDENT_AMBULATORY_CARE_PROVIDER_SITE_OTHER): Payer: BLUE CROSS/BLUE SHIELD | Admitting: Family Medicine

## 2014-12-25 ENCOUNTER — Encounter: Payer: Self-pay | Admitting: Family Medicine

## 2014-12-25 VITALS — BP 121/83 | HR 96 | Ht 62.0 in | Wt 170.0 lb

## 2014-12-25 DIAGNOSIS — M25571 Pain in right ankle and joints of right foot: Secondary | ICD-10-CM

## 2014-12-25 NOTE — Patient Instructions (Signed)
Switch to the boot when up and walking around. Wear this for at least 2 weeks - up to 4 weeks if you're still dealing with pain. Come out of this a couple times a day to do motion exercises though. Icing as needed. Follow up with me in 4 weeks for reevaluation. Consider injection if not doing well.

## 2014-12-26 NOTE — Progress Notes (Signed)
PCP: Rudi HeapMOORE, DONALD, MD  Subjective:   HPI: Patient is a 24 y.o. female here for ankle injury.  5/27: Patient reports on 5/25 she accidentally fell down 2 steps, landed with foot underneath her. Difficulty bearing weight on right foot since then. Inverted ankle in the fall. No prior injuries. Swelling but no bruising. Given ASO though painful in this with the pressure. Taking tylenol. Walking on ball of foot.  6/10: Patient reports she feels about the same. Doing HEP, wearing boot. + swelling. Pain lateral and medial.  7/7: Patient again reports persistent pain at 5/10 level, up to 7/10 when walking. Doing physical therapy and wearing ASO. + swelling.  8/23: Patient reports she's only a little better - pain level 4/10. Done with PT for about 3 weeks now, doing home exercises and riding bike. Using ASO when walking outside the house. Still feels unstable. + swelling if walking a long time.  10/11: Patient returns with 3/10 level of pain lateral ankle. Using ASO, doing home exercises. Pain is sharp and sore. Gets cramp in foot at times. Saw Dr. Roda ShuttersXu - recommended trying cast and/or injection - she would like to do cast - did not have someone to drive her home at that visit though. He also recommended minimum 2 more weeks conservative treatment before considering surgery. No skin changes, fever, other complaints.  10/25: Patient returns for a new cast. Pain level 0/10 lateral ankle. No swelling, bruising. No skin changes, fever, other complaints. Cast has worn at the heel.  11/8: Patient reports 0/10 level pain lateral ankle now. No swelling, bruising. No skin changes, fever, other complaints.  Past Medical History  Diagnosis Date  . Asthma   . Hypothyroidism     Current Outpatient Prescriptions on File Prior to Visit  Medication Sig Dispense Refill  . fluticasone (FLONASE) 50 MCG/ACT nasal spray Place 2 sprays into both nostrils daily. 16 g 6  . loratadine  (CLARITIN) 10 MG tablet Take 1 tablet (10 mg total) by mouth daily. 30 tablet 11  . nitrofurantoin, macrocrystal-monohydrate, (MACROBID) 100 MG capsule Take 1 capsule (100 mg total) by mouth 2 (two) times daily. 20 capsule 0   No current facility-administered medications on file prior to visit.    Past Surgical History  Procedure Laterality Date  . Cesarean section      No Known Allergies  Social History   Social History  . Marital Status: Married    Spouse Name: N/A  . Number of Children: N/A  . Years of Education: N/A   Occupational History  . Not on file.   Social History Main Topics  . Smoking status: Never Smoker   . Smokeless tobacco: Not on file  . Alcohol Use: No  . Drug Use: No  . Sexual Activity: Yes    Birth Control/ Protection: None   Other Topics Concern  . Not on file   Social History Narrative    Family History  Problem Relation Age of Onset  . Thyroid disease Mother   . Hypertension Mother   . Hypertension Father     BP 121/83 mmHg  Pulse 96  Ht 5\' 2"  (1.575 m)  Wt 170 lb (77.111 kg)  BMI 31.09 kg/m2  Review of Systems: See HPI above.    Objective:  Physical Exam:  Gen: NAD  Right ankle: Cast removed Mild lateral swelling.  No bruising, other deformity. Mild limitation ROM all directions. Minimal TTP over ATFL.  No focal bony tenderness. 1+ ant drawer, negative  talar tilt.  Negative syndesmotic compression. Thompsons test negative. NV intact distally.    Left ankle: FROM without pain.  Assessment & Plan:  1. Right ankle injury - radiographs negative.  MRI showed partial ATFL tear.  She has had improvement with immobilization.  Will switch to cam walker for at least 2 more weeks (up to 4).  HEP.  Icing, elevation.  F/u in 4 weeks.  Consider injection, appointment with Dr. Roda Shutters if still not improving.

## 2014-12-26 NOTE — Assessment & Plan Note (Signed)
radiographs negative.  MRI showed partial ATFL tear.  She has had improvement with immobilization.  Will switch to cam walker for at least 2 more weeks (up to 4).  HEP.  Icing, elevation.  F/u in 4 weeks.  Consider injection, appointment with Dr. Roda ShuttersXu if still not improving.

## 2015-01-02 ENCOUNTER — Telehealth: Payer: Self-pay | Admitting: Family Medicine

## 2015-01-02 NOTE — Telephone Encounter (Signed)
Spoke to patient and gave her information provided by physician. 

## 2015-01-02 NOTE — Telephone Encounter (Signed)
Spoke to patient and gave her the information provided by the physician.  

## 2015-01-02 NOTE — Telephone Encounter (Signed)
I don't think she can be released for full duty yet regardless of her exam.  She needs to be on light duty with the restrictions we laid out for 4 weeks, see us back, and we can make a determination then if it's ok for her to return to full duty.

## 2015-01-02 NOTE — Telephone Encounter (Signed)
I can't write her out of work just because she would need transportation.  If they have a job that meets her restrictions she will have to do it.

## 2015-01-03 ENCOUNTER — Ambulatory Visit: Payer: BLUE CROSS/BLUE SHIELD | Admitting: Pediatrics

## 2015-01-03 ENCOUNTER — Ambulatory Visit (INDEPENDENT_AMBULATORY_CARE_PROVIDER_SITE_OTHER): Payer: BLUE CROSS/BLUE SHIELD | Admitting: Pediatrics

## 2015-01-03 ENCOUNTER — Encounter: Payer: Self-pay | Admitting: Pediatrics

## 2015-01-03 VITALS — BP 119/85 | HR 103 | Temp 97.5°F | Ht 62.0 in | Wt 186.0 lb

## 2015-01-03 DIAGNOSIS — J069 Acute upper respiratory infection, unspecified: Secondary | ICD-10-CM | POA: Diagnosis not present

## 2015-01-03 DIAGNOSIS — Z23 Encounter for immunization: Secondary | ICD-10-CM | POA: Diagnosis not present

## 2015-01-03 DIAGNOSIS — J019 Acute sinusitis, unspecified: Secondary | ICD-10-CM

## 2015-01-03 MED ORDER — AZITHROMYCIN 250 MG PO TABS: ORAL_TABLET | ORAL | Status: DC

## 2015-01-03 NOTE — Progress Notes (Signed)
    Subjective:    Patient ID: Sara Clarke, female    DOB: 05/25/1990, 24 y.o.   MRN: [id number]  CC: uri sx  HPI: Sara AxHeather L Clarke is a 24 y.o. female presenting on 01/03/2015 for Cough and Nasal Congestion  Nose stopped up, coughing Started having stomach pain a week ago with some nausea, got better then the congesiton started More head congestion over past few days Temp up to 99-100 Some sore throat Taken nyquil, sleeping most of the day  Back to work this weekend after 6 months out for ankle injury   Relevant past medical, surgical, family and social history reviewed and updated as indicated. Interim medical history since our last visit reviewed. Allergies and medications reviewed and updated.   ROS: Per HPI unless specifically indicated above  Past Medical History Patient Active Problem List   Diagnosis Date Noted  . Right ankle pain 07/17/2014  . Unspecified hypothyroidism 04/17/2012  . 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 azithromycin (ZITHROMAX) 250 MG tablet Take 2 the first day and then one each day after. 6 tablet 0  . fluticasone (FLONASE) 50 MCG/ACT nasal spray Place 2 sprays into both nostrils daily. (Patient not taking: Reported on 01/03/2015) 16 g 6  . loratadine (CLARITIN) 10 MG tablet Take 1 tablet (10 mg total) by mouth daily. (Patient not taking: Reported on 01/03/2015) 30 tablet 11   No current facility-administered medications for this visit.       Objective:    BP 119/85 mmHg  Pulse 103  Temp(Src) 97.5 F (36.4 C) (Oral)  Ht 5\' 2"  (1.575 m)  Wt 186 lb (84.369 kg)  BMI 34.01 kg/m2  Wt Readings from Last 3 Encounters:  01/03/15 186 lb (84.369 kg)  12/25/14 170 lb (77.111 kg)  12/11/14 170 lb (77.111 kg)    Gen: NAD, alert, cooperative with exam, NCAT EYES: EOMI, no scleral injection or icterus ENT:  TMs pearly gray b/l, OP with mild erythema, generous  tonsils, no exudates LYMPH: no cervical LAD CV: NRRR, normal S1/S2, no murmur, distal pulses 2+ b/l Resp: CTABL, no wheezes, normal WOB Abd: +BS, soft, NTND. no guarding or organomegaly Ext: No edema, warm Neuro: Alert and oriented, strength equal b/l UE and LE, coordination grossly normal MSK: normal muscle bulk     Assessment & Plan:   Sara Clarke was seen today for cough and nasal congestion due to acute viral URI. Discussed symptomatic care, see pt instructions. If not starting to feel better early next week start Rx for antibiotics as below.  Diagnoses and all orders for this visit:  Acute URI  Acute sinusitis, recurrence not specified, unspecified location -     azithromycin (ZITHROMAX) 250 MG tablet; Take 2 the first day and then one each day after.   Follow up plan: Return if symptoms worsen or fail to improve.  Rex Krasarol Lexxi Koslow, MD [facility] 01/03/2015, 3:42 PM

## 2015-01-03 NOTE — Patient Instructions (Signed)
Netipot twice a day with distilled water Ibuprofen 600mg  three times a day Flonase after the netipot If not getting better by Monday start the antibiotic

## 2015-01-08 ENCOUNTER — Ambulatory Visit: Payer: BLUE CROSS/BLUE SHIELD | Admitting: Family Medicine

## 2015-01-14 ENCOUNTER — Telehealth: Payer: Self-pay | Admitting: Family Medicine

## 2015-01-22 ENCOUNTER — Ambulatory Visit: Payer: BLUE CROSS/BLUE SHIELD | Admitting: Family Medicine

## 2015-01-24 ENCOUNTER — Ambulatory Visit (INDEPENDENT_AMBULATORY_CARE_PROVIDER_SITE_OTHER): Payer: BLUE CROSS/BLUE SHIELD | Admitting: Family Medicine

## 2015-01-24 ENCOUNTER — Encounter: Payer: Self-pay | Admitting: Family Medicine

## 2015-01-24 VITALS — BP 147/89 | HR 118 | Ht 61.0 in | Wt 180.0 lb

## 2015-01-24 DIAGNOSIS — M25571 Pain in right ankle and joints of right foot: Secondary | ICD-10-CM

## 2015-01-24 NOTE — Patient Instructions (Signed)
Follow up with me in 6 weeks. Bracing just as needed now. Continue home exercises 3-4 times a week for 6 more weeks then stop. Ok to return to full duty.

## 2015-01-28 ENCOUNTER — Ambulatory Visit: Payer: BLUE CROSS/BLUE SHIELD | Admitting: Family Medicine

## 2015-01-29 ENCOUNTER — Ambulatory Visit: Payer: BLUE CROSS/BLUE SHIELD | Admitting: Family

## 2015-01-30 NOTE — Assessment & Plan Note (Signed)
radiographs negative.  MRI showed partial ATFL tear.  Improved following immobilization.  Now doing home exercises.  Advised to use ASO only if needed.  Return to full duty.  F/u in 6 weeks for likely last visit unless she is having problems.

## 2015-01-30 NOTE — Progress Notes (Signed)
PCP: Rudi Heap, MD  Subjective:   HPI: Patient is a 24 y.o. female here for ankle injury.  5/27: Patient reports on 5/25 she accidentally fell down 2 steps, landed with foot underneath her. Difficulty bearing weight on right foot since then. Inverted ankle in the fall. No prior injuries. Swelling but no bruising. Given ASO though painful in this with the pressure. Taking tylenol. Walking on ball of foot.  6/10: Patient reports she feels about the same. Doing HEP, wearing boot. + swelling. Pain lateral and medial.  7/7: Patient again reports persistent pain at 5/10 level, up to 7/10 when walking. Doing physical therapy and wearing ASO. + swelling.  8/23: Patient reports she's only a little better - pain level 4/10. Done with PT for about 3 weeks now, doing home exercises and riding bike. Using ASO when walking outside the house. Still feels unstable. + swelling if walking a long time.  10/11: Patient returns with 3/10 level of pain lateral ankle. Using ASO, doing home exercises. Pain is sharp and sore. Gets cramp in foot at times. Saw Dr. Roda Shutters - recommended trying cast and/or injection - she would like to do cast - did not have someone to drive her home at that visit though. He also recommended minimum 2 more weeks conservative treatment before considering surgery. No skin changes, fever, other complaints.  10/25: Patient returns for a new cast. Pain level 0/10 lateral ankle. No swelling, bruising. No skin changes, fever, other complaints. Cast has worn at the heel.  11/8: Patient reports 0/10 level pain lateral ankle now. No swelling, bruising. No skin changes, fever, other complaints.  12/8: Patient reports she is doing well. Stopped using boot 3 weeks ago and not wearing ASO currently. Doing home motion exercises. Back at work - pain level 0/10. Does get some soreness by end of work shift.  Past Medical History  Diagnosis Date  . Asthma   .  Hypothyroidism     Current Outpatient Prescriptions on File Prior to Visit  Medication Sig Dispense Refill  . azithromycin (ZITHROMAX) 250 MG tablet Take 2 the first day and then one each day after. 6 tablet 0  . fluticasone (FLONASE) 50 MCG/ACT nasal spray Place 2 sprays into both nostrils daily. (Patient not taking: Reported on 01/03/2015) 16 g 6  . loratadine (CLARITIN) 10 MG tablet Take 1 tablet (10 mg total) by mouth daily. (Patient not taking: Reported on 01/03/2015) 30 tablet 11   No current facility-administered medications on file prior to visit.    Past Surgical History  Procedure Laterality Date  . Cesarean section      No Known Allergies  Social History   Social History  . Marital Status: Married    Spouse Name: N/A  . Number of Children: N/A  . Years of Education: N/A   Occupational History  . Not on file.   Social History Main Topics  . Smoking status: Never Smoker   . Smokeless tobacco: Not on file  . Alcohol Use: No  . Drug Use: No  . Sexual Activity: Yes    Birth Control/ Protection: None   Other Topics Concern  . Not on file   Social History Narrative    Family History  Problem Relation Age of Onset  . Thyroid disease Mother   . Hypertension Mother   . Hypertension Father     BP 147/89 mmHg  Pulse 118  Ht  (1.549 m)  Wt 180 lb (81.647 kg)  BMI 34.03  kg/m2  Review of Systems: See HPI above.    Objective:  Physical Exam:  Gen: NAD  Right ankle: Cast removed No swelling, bruising, other deformity. FROM Minimal TTP over ATFL.  No focal bony tenderness. Trace ant drawer, negative talar tilt.  Negative syndesmotic compression. Thompsons test negative. NV intact distally.    Left ankle: FROM without pain.  Assessment & Plan:  1. Right ankle injury - radiographs negative.  MRI showed partial ATFL tear.  Improved following immobilization.  Now doing home exercises.  Advised to use ASO only if needed.  Return to full duty.   F/u in 6 weeks for likely last visit unless she is having problems.

## 2015-02-21 NOTE — Telephone Encounter (Signed)
Finished

## 2015-03-11 ENCOUNTER — Ambulatory Visit: Payer: BLUE CROSS/BLUE SHIELD | Admitting: Family Medicine

## 2015-03-19 ENCOUNTER — Encounter: Payer: Self-pay | Admitting: Family Medicine

## 2015-03-19 ENCOUNTER — Ambulatory Visit (INDEPENDENT_AMBULATORY_CARE_PROVIDER_SITE_OTHER): Payer: BLUE CROSS/BLUE SHIELD | Admitting: Family Medicine

## 2015-03-19 ENCOUNTER — Ambulatory Visit: Payer: BLUE CROSS/BLUE SHIELD | Admitting: Family Medicine

## 2015-03-19 VITALS — BP 119/81 | HR 67 | Ht 61.0 in | Wt 180.0 lb

## 2015-03-19 DIAGNOSIS — M25571 Pain in right ankle and joints of right foot: Secondary | ICD-10-CM

## 2015-03-21 NOTE — Progress Notes (Signed)
PCP: Rudi Heap, MD  Subjective:   HPI: Patient is a 25 y.o. female here for ankle injury.  5/27: Patient reports on 5/25 she accidentally fell down 2 steps, landed with foot underneath her. Difficulty bearing weight on right foot since then. Inverted ankle in the fall. No prior injuries. Swelling but no bruising. Given ASO though painful in this with the pressure. Taking tylenol. Walking on ball of foot.  6/10: Patient reports she feels about the same. Doing HEP, wearing boot. + swelling. Pain lateral and medial.  7/7: Patient again reports persistent pain at 5/10 level, up to 7/10 when walking. Doing physical therapy and wearing ASO. + swelling.  8/23: Patient reports she's only a little better - pain level 4/10. Done with PT for about 3 weeks now, doing home exercises and riding bike. Using ASO when walking outside the house. Still feels unstable. + swelling if walking a long time.  10/11: Patient returns with 3/10 level of pain lateral ankle. Using ASO, doing home exercises. Pain is sharp and sore. Gets cramp in foot at times. Saw Dr. Roda Shutters - recommended trying cast and/or injection - she would like to do cast - did not have someone to drive her home at that visit though. He also recommended minimum 2 more weeks conservative treatment before considering surgery. No skin changes, fever, other complaints.  10/25: Patient returns for a new cast. Pain level 0/10 lateral ankle. No swelling, bruising. No skin changes, fever, other complaints. Cast has worn at the heel.  11/8: Patient reports 0/10 level pain lateral ankle now. No swelling, bruising. No skin changes, fever, other complaints.  12/8: Patient reports she is doing well. Stopped using boot 3 weeks ago and not wearing ASO currently. Doing home motion exercises. Back at work - pain level 0/10. Does get some soreness by end of work shift.  03/19/15: Patient reports she is doing well. No  swelling. No longer wearing ASO. A little soreness but tolerable. Pain 0/10 now. No skin changes, fever.  Past Medical History  Diagnosis Date  . Asthma   . Hypothyroidism     Current Outpatient Prescriptions on File Prior to Visit  Medication Sig Dispense Refill  . azithromycin (ZITHROMAX) 250 MG tablet Take 2 the first day and then one each day after. 6 tablet 0  . fluticasone (FLONASE) 50 MCG/ACT nasal spray Place 2 sprays into both nostrils daily. (Patient not taking: Reported on 01/03/2015) 16 g 6  . loratadine (CLARITIN) 10 MG tablet Take 1 tablet (10 mg total) by mouth daily. (Patient not taking: Reported on 01/03/2015) 30 tablet 11   No current facility-administered medications on file prior to visit.    Past Surgical History  Procedure Laterality Date  . Cesarean section      No Known Allergies  Social History   Social History  . Marital Status: Married    Spouse Name: N/A  . Number of Children: N/A  . Years of Education: N/A   Occupational History  . Not on file.   Social History Main Topics  . Smoking status: Never Smoker   . Smokeless tobacco: Not on file  . Alcohol Use: No  . Drug Use: No  . Sexual Activity: Yes    Birth Control/ Protection: None   Other Topics Concern  . Not on file   Social History Narrative    Family History  Problem Relation Age of Onset  . Thyroid disease Mother   . Hypertension Mother   . Hypertension  Father     BP 119/81 mmHg  Pulse 67  Ht  (1.549 m)  Wt 180 lb (81.647 kg)  BMI 34.03 kg/m2  Review of Systems: See HPI above.    Objective:  Physical Exam:  Gen: NAD  Right ankle: No swelling, bruising, other deformity. FROM Minimal TTP over ATFL.  No focal bony tenderness. Trace ant drawer, negative talar tilt.  Negative syndesmotic compression. Thompsons test negative. NV intact distally.    Left ankle: FROM without pain.  Assessment & Plan:  1. Right ankle injury - radiographs negative.   MRI showed partial ATFL tear.  Improved following immobilization.  Continuing with home exercises.  As she is doing well will release her at this point.  F/u prn.

## 2015-03-21 NOTE — Assessment & Plan Note (Signed)
radiographs negative.  MRI showed partial ATFL tear.  Improved following immobilization.  Continuing with home exercises.  As she is doing well will release her at this point.  F/u prn.

## 2015-07-25 DIAGNOSIS — Z01419 Encounter for gynecological examination (general) (routine) without abnormal findings: Secondary | ICD-10-CM | POA: Diagnosis not present

## 2015-07-25 DIAGNOSIS — N925 Other specified irregular menstruation: Secondary | ICD-10-CM | POA: Diagnosis not present

## 2015-07-25 DIAGNOSIS — Z36 Encounter for antenatal screening of mother: Secondary | ICD-10-CM | POA: Diagnosis not present

## 2015-07-25 DIAGNOSIS — Z3201 Encounter for pregnancy test, result positive: Secondary | ICD-10-CM | POA: Diagnosis not present

## 2015-07-25 DIAGNOSIS — Z124 Encounter for screening for malignant neoplasm of cervix: Secondary | ICD-10-CM | POA: Diagnosis not present

## 2015-07-25 DIAGNOSIS — Z3480 Encounter for supervision of other normal pregnancy, unspecified trimester: Secondary | ICD-10-CM | POA: Diagnosis not present

## 2015-07-25 LAB — OB RESULTS CONSOLE ABO/RH: RH Type: POSITIVE

## 2015-07-25 LAB — OB RESULTS CONSOLE RUBELLA ANTIBODY, IGM: Rubella: IMMUNE

## 2015-07-25 LAB — OB RESULTS CONSOLE HEPATITIS B SURFACE ANTIGEN: Hepatitis B Surface Ag: NEGATIVE

## 2015-07-25 LAB — OB RESULTS CONSOLE GC/CHLAMYDIA
CHLAMYDIA, DNA PROBE: NEGATIVE
GC PROBE AMP, GENITAL: NEGATIVE

## 2015-07-25 LAB — OB RESULTS CONSOLE RPR: RPR: NONREACTIVE

## 2015-07-25 LAB — OB RESULTS CONSOLE HIV ANTIBODY (ROUTINE TESTING): HIV: NONREACTIVE

## 2015-07-25 LAB — OB RESULTS CONSOLE ANTIBODY SCREEN: Antibody Screen: NEGATIVE

## 2015-08-21 DIAGNOSIS — Z3480 Encounter for supervision of other normal pregnancy, unspecified trimester: Secondary | ICD-10-CM | POA: Diagnosis not present

## 2015-08-21 DIAGNOSIS — O359XX1 Maternal care for (suspected) fetal abnormality and damage, unspecified, fetus 1: Secondary | ICD-10-CM | POA: Diagnosis not present

## 2015-09-18 DIAGNOSIS — Z3480 Encounter for supervision of other normal pregnancy, unspecified trimester: Secondary | ICD-10-CM | POA: Diagnosis not present

## 2015-09-25 DIAGNOSIS — Z6835 Body mass index (BMI) 35.0-35.9, adult: Secondary | ICD-10-CM | POA: Diagnosis not present

## 2015-09-25 DIAGNOSIS — N898 Other specified noninflammatory disorders of vagina: Secondary | ICD-10-CM | POA: Diagnosis not present

## 2015-09-25 DIAGNOSIS — R8271 Bacteriuria: Secondary | ICD-10-CM | POA: Diagnosis not present

## 2015-10-06 ENCOUNTER — Encounter (HOSPITAL_COMMUNITY): Payer: Self-pay | Admitting: *Deleted

## 2015-10-06 ENCOUNTER — Inpatient Hospital Stay (HOSPITAL_COMMUNITY)
Admission: AD | Admit: 2015-10-06 | Discharge: 2015-10-06 | Disposition: A | Discharge status: Home / Self Care | Payer: BLUE CROSS/BLUE SHIELD | Source: Ambulatory Visit | Attending: Obstetrics and Gynecology | Admitting: Obstetrics and Gynecology

## 2015-10-06 DIAGNOSIS — R42 Dizziness and giddiness: Secondary | ICD-10-CM | POA: Insufficient documentation

## 2015-10-06 DIAGNOSIS — Z3A29 29 weeks gestation of pregnancy: Secondary | ICD-10-CM | POA: Diagnosis not present

## 2015-10-06 DIAGNOSIS — O9989 Other specified diseases and conditions complicating pregnancy, childbirth and the puerperium: Secondary | ICD-10-CM

## 2015-10-06 DIAGNOSIS — Z3493 Encounter for supervision of normal pregnancy, unspecified, third trimester: Secondary | ICD-10-CM

## 2015-10-06 DIAGNOSIS — H538 Other visual disturbances: Secondary | ICD-10-CM | POA: Insufficient documentation

## 2015-10-06 DIAGNOSIS — O26893 Other specified pregnancy related conditions, third trimester: Secondary | ICD-10-CM | POA: Insufficient documentation

## 2015-10-06 DIAGNOSIS — R51 Headache: Secondary | ICD-10-CM | POA: Diagnosis not present

## 2015-10-06 DIAGNOSIS — Z87440 Personal history of urinary (tract) infections: Secondary | ICD-10-CM | POA: Diagnosis not present

## 2015-10-06 LAB — URINALYSIS, ROUTINE W REFLEX MICROSCOPIC
BILIRUBIN URINE: NEGATIVE
Glucose, UA: NEGATIVE mg/dL
HGB URINE DIPSTICK: NEGATIVE
KETONES UR: NEGATIVE mg/dL
NITRITE: NEGATIVE
PROTEIN: NEGATIVE mg/dL
pH: 7 (ref 5.0–8.0)

## 2015-10-06 LAB — URINE MICROSCOPIC-ADD ON

## 2015-10-06 LAB — BASIC METABOLIC PANEL
ANION GAP: 7 (ref 5–15)
CO2: 24 mmol/L (ref 22–32)
Calcium: 8.7 mg/dL — ABNORMAL LOW (ref 8.9–10.3)
Chloride: 104 mmol/L (ref 101–111)
Creatinine, Ser: 0.5 mg/dL (ref 0.44–1.00)
GFR calc Af Amer: >60 mL/min (ref >60)
GLUCOSE: 85 mg/dL (ref 65–99)
POTASSIUM: 3.7 mmol/L (ref 3.5–5.1)
Sodium: 135 mmol/L (ref 135–145)

## 2015-10-06 LAB — CBC
HEMATOCRIT: 32.3 % — AB (ref 36.0–46.0)
HEMOGLOBIN: 10.8 g/dL — AB (ref 12.0–15.0)
MCH: 26.1 pg (ref 26.0–34.0)
MCHC: 33.4 g/dL (ref 30.0–36.0)
MCV: 78 fL (ref 78.0–100.0)
Platelets: 318 10*3/uL (ref 150–400)
RBC: 4.14 MIL/uL (ref 3.87–5.11)
RDW: 14.1 % (ref 11.5–15.5)
WBC: 12.3 10*3/uL — ABNORMAL HIGH (ref 4.0–10.5)

## 2015-10-06 MED ORDER — LACTATED RINGERS IV BOLUS (SEPSIS)
1000.0000 mL | Freq: Once | INTRAVENOUS | Status: AC
  Administered 2015-10-06: 1000 mL via INTRAVENOUS

## 2015-10-06 MED ORDER — ACETAMINOPHEN 325 MG PO TABS
650.0000 mg | ORAL_TABLET | Freq: Once | ORAL | Status: AC
Start: 2015-10-06 — End: 2015-10-06
  Administered 2015-10-06: 650 mg via ORAL
  Filled 2015-10-06: qty 2

## 2015-10-06 NOTE — MAU Provider Note (Signed)
Chief Complaint:  Dizziness; Blurred Vision; and Hip Pain   First Provider Initiated Contact with Patient 10/06/15 [phone]     HPI: Sara Clarke is a 25 y.o. G2P1001 at [id number] who presents to maternity admissions reporting lightheadedness, shakiness, blurred vision, and headache. Symptoms started this AM after she woke up. Patient did not eat breakfast, only a candybar. Went to work, at [employer]. Denies N/V. Ate dinner last night. Drank 4 bottles of water today and lemonade. Urinating without complication. Has had 2 UTIs this pregnancy. Lightheaded when standing up or bending over. No LOC. Has not taken anything for headache, not severe, just a dull achy pain. Denies CP.   Denies contractions, leakage of fluid or vaginal bleeding. Good fetal movement.   Pregnancy Course: Multiple UTIs  Past Medical History: Past Medical History:  Diagnosis Date  . Asthma   . Hypothyroidism     Past obstetric history: OB History  Gravida Para Term Preterm AB Living  2 1 1     1   SAB TAB Ectopic Multiple Live Births          1    # Outcome Date GA Lbr Len/2nd Weight Sex Delivery Anes PTL Lv  2 Current           1 Term 05/31/09     CS-[id number]         Past Surgical History: Past Surgical History:  Procedure Laterality Date  . CESAREAN SECTION       Family History: Family History  Problem Relation Age of Onset  . Thyroid disease Mother   . Hypertension Mother   . Hypertension Father     Social History: Social History  Substance Use Topics  . Smoking status: Never Smoker  . Smokeless tobacco: Never Used  . Alcohol use No    Allergies: No Known Allergies  Meds:  Prescriptions Prior to Admission  Medication Sig Dispense Refill Last Dose  . Prenatal Vit-Fe Fumarate-FA (MULTIVITAMIN-PRENATAL) 27-0.8 MG TABS tablet Take 1 tablet by mouth daily at 12 noon.   10/05/2015 at Unknown time    I have reviewed patient's Past Medical Hx, Surgical Hx, Family Hx, Social Hx, medications and  allergies.   ROS:  Review of Systems  Physical Exam   Patient Vitals for the past 24 hrs:  BP Temp Temp src Pulse SpO2 Height Weight  10/06/15 0929 112/66 98.6 F (37 C) Oral 101 100 % - -  10/06/15 19140921 - - - - - 5\' 1"  (1.549 m) 190 lb 1.3 oz (86.2 kg)   Constitutional: Well-developed, well-nourished female in no acute distress.  Cardiovascular: normal rate, rhythm Respiratory: normal effort GI: Abd soft, non-tender, gravid appropriate for gestational age. Pos BS x 4 MS: Extremities nontender, trace edema, normal ROM Neurologic: Alert and oriented x 4.  GU: Neg CVAT.    FHT:  Baseline 140, moderate variability, accelerations present, no decelerations Contractions:  None   Labs: Results for orders placed or performed during the hospital encounter of 10/06/15 (from the past 24 hour(s))  Urinalysis, Routine w reflex microscopic (not at [facility])     Status: Abnormal   Collection Time: 10/06/15  9:20 AM  Result Value Ref Range   Color, Urine YELLOW YELLOW   APPearance CLEAR CLEAR   Specific Gravity, Urine <1.005 (L) 1.005 - 1.030   pH 7.0 5.0 - 8.0   Glucose, UA NEGATIVE NEGATIVE mg/dL   Hgb urine dipstick NEGATIVE NEGATIVE   Bilirubin Urine NEGATIVE NEGATIVE   Ketones, ur  NEGATIVE NEGATIVE mg/dL   Protein, ur NEGATIVE NEGATIVE mg/dL   Nitrite NEGATIVE NEGATIVE   Leukocytes, UA SMALL (A) NEGATIVE  Urine microscopic-add on     Status: Abnormal   Collection Time: 10/06/15  9:20 AM  Result Value Ref Range   Squamous Epithelial / LPF 0-5 (A) NONE SEEN   WBC, UA 0-5 0 - 5 WBC/hpf   RBC / HPF 0-5 0 - 5 RBC/hpf   Bacteria, UA RARE (A) NONE SEEN  Basic metabolic panel     Status: Abnormal   Collection Time: 10/06/15 10:21 AM  Result Value Ref Range   Sodium 135 135 - 145 mmol/L   Potassium 3.7 3.5 - 5.1 mmol/L   Chloride 104 101 - 111 mmol/L   CO2 24 22 - 32 mmol/L   Glucose, Bld 85 65 - 99 mg/dL   BUN <5 (L) 6 - 20 mg/dL   Creatinine, Ser 1.610.50 0.44 - 1.00 mg/dL   Calcium  8.7 (L) 8.9 - 10.3 mg/dL   GFR calc non Af Amer >60 >60 mL/min   GFR calc Af Amer >60 >60 mL/min   Anion gap 7 5 - 15  CBC     Status: Abnormal   Collection Time: 10/06/15 10:21 AM  Result Value Ref Range   WBC 12.3 (H) 4.0 - 10.5 K/uL   RBC 4.14 3.87 - 5.11 MIL/uL   Hemoglobin 10.8 (L) 12.0 - 15.0 g/dL   HCT 09.632.3 (L) 04.536.0 - 40.946.0 %   MCV 78.0 78.0 - 100.0 fL   MCH 26.1 26.0 - 34.0 pg   MCHC 33.4 30.0 - 36.0 g/dL   RDW 81.114.1 91.411.5 - 78.215.5 %   Platelets 318 150 - 400 K/uL    Imaging:  No results found.  MAU Course: 11:41 AM - Orthostatic vitals, CBC, BMP, urinalysis is normal, 1L LR bolus, Tylenol. Given crackers and peanut butter.   11:41 AM - Reevaluated patient, had patient walk the halls, she is feeling better, slightly lightheaded when she stood up but it improved. Spoke with Dr. Claiborne Billingsallahan who agrees to discharge patient and emphasized to eat meals (even small ones) throughout the day and snacks. Avoid candybars as a meal.   I personally reviewed the patient's NST today, found to be REACTIVE. 140 bpm, mod var, +accels, no decels. CTX: NONE.   MDM:  Plan of care reviewed with patient, including labs and tests ordered and medical treatment.   Assessment: 1. Lightheadedness   2. Normal pregnancy, third trimester     Plan: Discharge home in stable condition.  Labor precautions and fetal kick counts Follow-up Information    [facility] in 1 week(s).   Why:  Routine prenatal care Contact information: [address] [place] [address] [phone]             Medication List    TAKE these medications   multivitamin-prenatal 27-0.8 MG Tabs tablet Take 1 tablet by mouth daily at 12 noon.       [address] [place], [place] 10/06/2015 11:41 AM

## 2015-10-06 NOTE — Discharge Instructions (Signed)

## 2015-10-06 NOTE — MAU Note (Signed)
Pt states that this morning after she woke up she was at work and felt lightheaded and her vision got a little bit blurry.  Pt states she felt dizzy.  Pt states she has some pain on the left under her breast but she is unsure if it has to do with the way the baby she lying.  Pt also states it is hard to take a deep breath.

## 2015-10-09 DIAGNOSIS — Z3480 Encounter for supervision of other normal pregnancy, unspecified trimester: Secondary | ICD-10-CM | POA: Diagnosis not present

## 2015-10-23 DIAGNOSIS — Z23 Encounter for immunization: Secondary | ICD-10-CM | POA: Diagnosis not present

## 2015-10-23 DIAGNOSIS — Z6837 Body mass index (BMI) 37.0-37.9, adult: Secondary | ICD-10-CM | POA: Diagnosis not present

## 2015-11-07 DIAGNOSIS — Z23 Encounter for immunization: Secondary | ICD-10-CM | POA: Diagnosis not present

## 2015-11-07 DIAGNOSIS — R8271 Bacteriuria: Secondary | ICD-10-CM | POA: Diagnosis not present

## 2015-11-08 ENCOUNTER — Other Ambulatory Visit: Payer: Self-pay | Admitting: Obstetrics and Gynecology

## 2015-11-21 DIAGNOSIS — Z3480 Encounter for supervision of other normal pregnancy, unspecified trimester: Secondary | ICD-10-CM | POA: Diagnosis not present

## 2015-11-21 LAB — OB RESULTS CONSOLE GBS: STREP GROUP B AG: NEGATIVE

## 2015-12-02 ENCOUNTER — Telehealth (HOSPITAL_COMMUNITY): Payer: Self-pay | Admitting: *Deleted

## 2015-12-02 NOTE — Telephone Encounter (Signed)
Preadmission screen  

## 2015-12-03 ENCOUNTER — Encounter (HOSPITAL_COMMUNITY): Payer: Self-pay

## 2015-12-10 ENCOUNTER — Encounter (HOSPITAL_COMMUNITY)
Admission: RE | Admit: 2015-12-10 | Discharge: 2015-12-10 | Disposition: A | Discharge status: Home / Self Care | Payer: BLUE CROSS/BLUE SHIELD | Source: Ambulatory Visit | Attending: Obstetrics and Gynecology | Admitting: Obstetrics and Gynecology

## 2015-12-10 DIAGNOSIS — E669 Obesity, unspecified: Secondary | ICD-10-CM | POA: Diagnosis not present

## 2015-12-10 DIAGNOSIS — Z3A39 39 weeks gestation of pregnancy: Secondary | ICD-10-CM | POA: Diagnosis not present

## 2015-12-10 DIAGNOSIS — Z6838 Body mass index (BMI) 38.0-38.9, adult: Secondary | ICD-10-CM | POA: Diagnosis not present

## 2015-12-10 DIAGNOSIS — O99214 Obesity complicating childbirth: Secondary | ICD-10-CM | POA: Diagnosis not present

## 2015-12-10 DIAGNOSIS — O34211 Maternal care for low transverse scar from previous cesarean delivery: Secondary | ICD-10-CM | POA: Diagnosis not present

## 2015-12-10 DIAGNOSIS — O34219 Maternal care for unspecified type scar from previous cesarean delivery: Secondary | ICD-10-CM | POA: Diagnosis not present

## 2015-12-10 DIAGNOSIS — Z8249 Family history of ischemic heart disease and other diseases of the circulatory system: Secondary | ICD-10-CM | POA: Diagnosis not present

## 2015-12-10 LAB — CBC
HEMATOCRIT: 33.3 % — AB (ref 36.0–46.0)
HEMOGLOBIN: 11.2 g/dL — AB (ref 12.0–15.0)
MCH: 26.6 pg (ref 26.0–34.0)
MCHC: 33.6 g/dL (ref 30.0–36.0)
MCV: 79.1 fL (ref 78.0–100.0)
Platelets: 225 10*3/uL (ref 150–400)
RBC: 4.21 MIL/uL (ref 3.87–5.11)
RDW: 15.4 % (ref 11.5–15.5)
WBC: 11 10*3/uL — AB (ref 4.0–10.5)

## 2015-12-10 LAB — ABO/RH: ABO/RH(D): O POS

## 2015-12-10 NOTE — Patient Instructions (Signed)
19 Sara Clarke  12/10/2015   Your procedure is scheduled on:  12/11/2015  Enter through the Main Entrance of [facility] at 1100 AM.  Pick up the phone at the desk and dial [phone].   Call this number if you have problems the morning of surgery: [phone]   Remember:   Do not eat food:After Midnight.  Do not drink clear liquids: After Midnight.  Take these medicines the morning of surgery with A SIP OF WATER: none   Do not wear jewelry, make-up or nail polish.  Do not wear lotions, powders, or perfumes. Do not wear deodorant.  Do not shave 48 hours prior to surgery.  Do not bring valuables to the hospital.  [facility] is not   responsible for any belongings or valuables brought to the hospital.  Contacts, dentures or bridgework may not be worn into surgery.  Leave suitcase in the car. After surgery it may be brought to your room.  For patients admitted to the hospital, checkout time is 11:00 AM the day of              discharge.   Patients discharged the day of surgery will not be allowed to drive             home.  Name and phone number of your driver: na  Special Instructions:   N/A   Please read over the following fact sheets that you were given:   Surgical Site Infection Prevention

## 2015-12-10 NOTE — Anesthesia Preprocedure Evaluation (Addendum)
Anesthesia Evaluation  Patient identified by MRN, date of birth, ID band Patient awake    Reviewed: Allergy & Precautions, NPO status , Patient's Chart, lab work & pertinent test results  History of Anesthesia Complications (+) PONV and history of anesthetic complications  Airway Mallampati: II  TM Distance: >3 FB Neck ROM: Full    Dental no notable dental hx. (+) Dental Advisory Given   Pulmonary asthma ,    Pulmonary exam normal breath sounds clear to auscultation       Cardiovascular negative cardio ROS Normal cardiovascular exam Rhythm:Regular Rate:Normal     Neuro/Psych negative neurological ROS  negative psych ROS   GI/Hepatic negative GI ROS, Neg liver ROS,   Endo/Other  Hypothyroidism obesity  Renal/GU negative Renal ROS  negative genitourinary   Musculoskeletal negative musculoskeletal ROS (+)   Abdominal   Peds negative pediatric ROS (+)  Hematology negative hematology ROS (+)   Anesthesia Other Findings   Reproductive/Obstetrics (+) Pregnancy                            Anesthesia Physical Anesthesia Plan  ASA: II  Anesthesia Plan: Spinal   Post-op Pain Management:    Induction:   Airway Management Planned:   Additional Equipment:   Intra-op Plan:   Post-operative Plan:   Informed Consent: I have reviewed the patients History and Physical, chart, labs and discussed the procedure including the risks, benefits and alternatives for the proposed anesthesia with the patient or authorized representative who has indicated his/her understanding and acceptance.   Dental advisory given  Plan Discussed with: CRNA  Anesthesia Plan Comments:         Anesthesia Quick Evaluation

## 2015-12-11 ENCOUNTER — Inpatient Hospital Stay (HOSPITAL_COMMUNITY)
Admission: RE | Admit: 2015-12-11 | Discharge: 2015-12-14 | DRG: 766 | Disposition: A | Discharge status: Home / Self Care | Payer: BLUE CROSS/BLUE SHIELD | Source: Ambulatory Visit | Attending: Obstetrics and Gynecology | Admitting: Obstetrics and Gynecology

## 2015-12-11 ENCOUNTER — Inpatient Hospital Stay (HOSPITAL_COMMUNITY): Payer: BLUE CROSS/BLUE SHIELD | Admitting: Anesthesiology

## 2015-12-11 ENCOUNTER — Encounter (HOSPITAL_COMMUNITY): Payer: Self-pay | Admitting: Emergency Medicine

## 2015-12-11 ENCOUNTER — Encounter (HOSPITAL_COMMUNITY)
Admission: RE | Disposition: A | Discharge status: Home / Self Care | Payer: Self-pay | Source: Ambulatory Visit | Attending: Obstetrics and Gynecology

## 2015-12-11 ENCOUNTER — Other Ambulatory Visit: Payer: Self-pay | Admitting: Obstetrics and Gynecology

## 2015-12-11 DIAGNOSIS — O34211 Maternal care for low transverse scar from previous cesarean delivery: Principal | ICD-10-CM | POA: Diagnosis present

## 2015-12-11 DIAGNOSIS — Z6838 Body mass index (BMI) 38.0-38.9, adult: Secondary | ICD-10-CM

## 2015-12-11 DIAGNOSIS — Z3A39 39 weeks gestation of pregnancy: Secondary | ICD-10-CM

## 2015-12-11 DIAGNOSIS — E669 Obesity, unspecified: Secondary | ICD-10-CM | POA: Diagnosis present

## 2015-12-11 DIAGNOSIS — Z8249 Family history of ischemic heart disease and other diseases of the circulatory system: Secondary | ICD-10-CM

## 2015-12-11 DIAGNOSIS — O34219 Maternal care for unspecified type scar from previous cesarean delivery: Secondary | ICD-10-CM | POA: Diagnosis not present

## 2015-12-11 DIAGNOSIS — O99214 Obesity complicating childbirth: Secondary | ICD-10-CM | POA: Diagnosis present

## 2015-12-11 HISTORY — DX: Nausea with vomiting, unspecified: R11.2

## 2015-12-11 HISTORY — DX: Other specified postprocedural states: Z98.890

## 2015-12-11 LAB — PREPARE RBC (CROSSMATCH)

## 2015-12-11 LAB — RPR: RPR: NONREACTIVE

## 2015-12-11 SURGERY — Surgical Case
Anesthesia: Spinal

## 2015-12-11 MED ORDER — NALBUPHINE HCL 10 MG/ML IJ SOLN
5.0000 mg | INTRAMUSCULAR | Status: DC | PRN
  Administered 2015-12-12: 5 mg via INTRAVENOUS
  Filled 2015-12-11: qty 1

## 2015-12-11 MED ORDER — NALBUPHINE HCL 10 MG/ML IJ SOLN: 5.0000 mg | Freq: Once | INTRAMUSCULAR | Status: DC | PRN

## 2015-12-11 MED ORDER — SCOPOLAMINE 1 MG/3DAYS TD PT72
MEDICATED_PATCH | TRANSDERMAL | Status: AC
  Administered 2015-12-11: 1.5 mg via TRANSDERMAL
  Filled 2015-12-11: qty 1

## 2015-12-11 MED ORDER — KETOROLAC TROMETHAMINE 30 MG/ML IJ SOLN
30.0000 mg | Freq: Four times a day (QID) | INTRAMUSCULAR | Status: AC | PRN
  Administered 2015-12-12 (×2): 30 mg via INTRAVENOUS
  Filled 2015-12-11 (×2): qty 1

## 2015-12-11 MED ORDER — ZOLPIDEM TARTRATE 5 MG PO TABS: 5.0000 mg | ORAL_TABLET | Freq: Every evening | ORAL | Status: DC | PRN

## 2015-12-11 MED ORDER — OXYTOCIN 40 UNITS IN LACTATED RINGERS INFUSION - SIMPLE MED: 2.5000 [IU]/h | INTRAVENOUS | Status: AC

## 2015-12-11 MED ORDER — FENTANYL CITRATE (PF) 100 MCG/2ML IJ SOLN
INTRAMUSCULAR | Status: AC
  Filled 2015-12-11: qty 2

## 2015-12-11 MED ORDER — DIPHENHYDRAMINE HCL 25 MG PO CAPS
25.0000 mg | ORAL_CAPSULE | ORAL | Status: DC | PRN
  Filled 2015-12-11: qty 1

## 2015-12-11 MED ORDER — OXYCODONE HCL 5 MG PO TABS
5.0000 mg | ORAL_TABLET | ORAL | Status: DC | PRN
  Administered 2015-12-13 – 2015-12-14 (×4): 5 mg via ORAL
  Filled 2015-12-11 (×5): qty 1

## 2015-12-11 MED ORDER — SCOPOLAMINE 1 MG/3DAYS TD PT72
1.0000 | MEDICATED_PATCH | Freq: Once | TRANSDERMAL | Status: DC
  Administered 2015-12-11: 1.5 mg via TRANSDERMAL

## 2015-12-11 MED ORDER — OXYCODONE HCL 5 MG PO TABS: 5.0000 mg | ORAL_TABLET | ORAL | Status: DC | PRN

## 2015-12-11 MED ORDER — METHYLERGONOVINE MALEATE 0.2 MG/ML IJ SOLN: 0.2000 mg | INTRAMUSCULAR | Status: DC | PRN

## 2015-12-11 MED ORDER — ACETAMINOPHEN 325 MG PO TABS
650.0000 mg | ORAL_TABLET | ORAL | Status: DC | PRN
  Administered 2015-12-13 – 2015-12-14 (×3): 650 mg via ORAL
  Filled 2015-12-11 (×3): qty 2

## 2015-12-11 MED ORDER — SIMETHICONE 80 MG PO CHEW
80.0000 mg | CHEWABLE_TABLET | Freq: Three times a day (TID) | ORAL | Status: DC
  Administered 2015-12-11 – 2015-12-14 (×8): 80 mg via ORAL
  Filled 2015-12-11 (×8): qty 1

## 2015-12-11 MED ORDER — METOCLOPRAMIDE HCL 5 MG/ML IJ SOLN
INTRAMUSCULAR | Status: DC | PRN
  Administered 2015-12-11 (×2): 5 mg via INTRAVENOUS

## 2015-12-11 MED ORDER — ONDANSETRON HCL 4 MG/2ML IJ SOLN: 4.0000 mg | Freq: Three times a day (TID) | INTRAMUSCULAR | Status: DC | PRN

## 2015-12-11 MED ORDER — NALOXONE HCL 0.4 MG/ML IJ SOLN: 0.4000 mg | INTRAMUSCULAR | Status: DC | PRN

## 2015-12-11 MED ORDER — OXYCODONE HCL 5 MG PO TABS: 10.0000 mg | ORAL_TABLET | ORAL | Status: DC | PRN

## 2015-12-11 MED ORDER — MENTHOL 3 MG MT LOZG
1.0000 | LOZENGE | OROMUCOSAL | Status: DC | PRN
  Administered 2015-12-13: 3 mg via ORAL
  Filled 2015-12-11: qty 9

## 2015-12-11 MED ORDER — ACETAMINOPHEN 500 MG PO TABS
1000.0000 mg | ORAL_TABLET | Freq: Four times a day (QID) | ORAL | Status: AC
  Administered 2015-12-11 – 2015-12-12 (×3): 1000 mg via ORAL
  Filled 2015-12-11 (×3): qty 2

## 2015-12-11 MED ORDER — KETOROLAC TROMETHAMINE 30 MG/ML IJ SOLN: 30.0000 mg | Freq: Four times a day (QID) | INTRAMUSCULAR | Status: AC | PRN

## 2015-12-11 MED ORDER — CEFAZOLIN SODIUM-DEXTROSE 2-4 GM/100ML-% IV SOLN
2.0000 g | INTRAVENOUS | Status: AC
  Administered 2015-12-11: 2 g via INTRAVENOUS

## 2015-12-11 MED ORDER — LACTATED RINGERS IV SOLN
INTRAVENOUS | Status: DC
  Administered 2015-12-11 (×4): via INTRAVENOUS

## 2015-12-11 MED ORDER — METOCLOPRAMIDE HCL 5 MG/ML IJ SOLN
INTRAMUSCULAR | Status: AC
  Filled 2015-12-11: qty 2

## 2015-12-11 MED ORDER — LACTATED RINGERS IV SOLN
INTRAVENOUS | Status: DC
  Administered 2015-12-11: 22:00:00 via INTRAVENOUS

## 2015-12-11 MED ORDER — SIMETHICONE 80 MG PO CHEW
80.0000 mg | CHEWABLE_TABLET | ORAL | Status: DC
  Administered 2015-12-12 – 2015-12-13 (×2): 80 mg via ORAL
  Filled 2015-12-11 (×3): qty 1

## 2015-12-11 MED ORDER — MEPERIDINE HCL 25 MG/ML IJ SOLN: 6.2500 mg | INTRAMUSCULAR | Status: DC | PRN

## 2015-12-11 MED ORDER — MORPHINE SULFATE (PF) 0.5 MG/ML IJ SOLN
INTRAMUSCULAR | Status: DC | PRN
  Administered 2015-12-11: .2 mg via EPIDURAL

## 2015-12-11 MED ORDER — COCONUT OIL OIL
1.0000 | TOPICAL_OIL | Status: DC | PRN
Start: 2015-12-11 — End: 2015-12-14

## 2015-12-11 MED ORDER — FENTANYL CITRATE (PF) 100 MCG/2ML IJ SOLN: 25.0000 ug | INTRAMUSCULAR | Status: DC | PRN

## 2015-12-11 MED ORDER — PRENATAL MULTIVITAMIN CH
1.0000 | ORAL_TABLET | Freq: Every day | ORAL | Status: DC
  Administered 2015-12-12 – 2015-12-13 (×2): 1 via ORAL
  Filled 2015-12-11 (×2): qty 1

## 2015-12-11 MED ORDER — SODIUM CHLORIDE 0.9% FLUSH: 3.0000 mL | INTRAVENOUS | Status: DC | PRN

## 2015-12-11 MED ORDER — PHENYLEPHRINE 8 MG IN D5W 100 ML (0.08MG/ML) PREMIX OPTIME
INJECTION | INTRAVENOUS | Status: DC | PRN
  Administered 2015-12-11: 60 ug/min via INTRAVENOUS

## 2015-12-11 MED ORDER — IBUPROFEN 600 MG PO TABS
600.0000 mg | ORAL_TABLET | Freq: Four times a day (QID) | ORAL | Status: DC
Start: 2015-12-11 — End: 2015-12-14
  Administered 2015-12-12 – 2015-12-14 (×8): 600 mg via ORAL
  Filled 2015-12-11 (×8): qty 1

## 2015-12-11 MED ORDER — NALOXONE HCL 2 MG/2ML IJ SOSY
1.0000 ug/kg/h | PREFILLED_SYRINGE | INTRAVENOUS | Status: DC | PRN
  Filled 2015-12-11: qty 2

## 2015-12-11 MED ORDER — ACETAMINOPHEN 500 MG PO TABS: 1000.0000 mg | ORAL_TABLET | Freq: Four times a day (QID) | ORAL | Status: DC

## 2015-12-11 MED ORDER — MORPHINE SULFATE-NACL 0.5-0.9 MG/ML-% IV SOSY
PREFILLED_SYRINGE | INTRAVENOUS | Status: AC
  Filled 2015-12-11: qty 1

## 2015-12-11 MED ORDER — DIPHENHYDRAMINE HCL 25 MG PO CAPS: 25.0000 mg | ORAL_CAPSULE | Freq: Four times a day (QID) | ORAL | Status: DC | PRN

## 2015-12-11 MED ORDER — DIPHENHYDRAMINE HCL 50 MG/ML IJ SOLN: 12.5000 mg | INTRAMUSCULAR | Status: DC | PRN

## 2015-12-11 MED ORDER — ONDANSETRON HCL 4 MG/2ML IJ SOLN: 4.0000 mg | Freq: Once | INTRAMUSCULAR | Status: DC | PRN

## 2015-12-11 MED ORDER — LACTATED RINGERS IV SOLN
Freq: Once | INTRAVENOUS | Status: AC
  Administered 2015-12-11: 13:00:00 via INTRAVENOUS

## 2015-12-11 MED ORDER — PHENYLEPHRINE 8 MG IN D5W 100 ML (0.08MG/ML) PREMIX OPTIME
INJECTION | INTRAVENOUS | Status: AC
  Filled 2015-12-11: qty 100

## 2015-12-11 MED ORDER — METHYLERGONOVINE MALEATE 0.2 MG PO TABS: 0.2000 mg | ORAL_TABLET | ORAL | Status: DC | PRN

## 2015-12-11 MED ORDER — NALBUPHINE HCL 10 MG/ML IJ SOLN: 5.0000 mg | INTRAMUSCULAR | Status: DC | PRN

## 2015-12-11 MED ORDER — TETANUS-DIPHTH-ACELL PERTUSSIS 5-2.5-18.5 LF-MCG/0.5 IM SUSP: 0.5000 mL | Freq: Once | INTRAMUSCULAR | Status: DC

## 2015-12-11 MED ORDER — OXYTOCIN 10 UNIT/ML IJ SOLN
INTRAMUSCULAR | Status: AC
  Filled 2015-12-11: qty 4

## 2015-12-11 MED ORDER — WITCH HAZEL-GLYCERIN EX PADS: 1.0000 "application " | MEDICATED_PAD | CUTANEOUS | Status: DC | PRN

## 2015-12-11 MED ORDER — PHENYLEPHRINE 40 MCG/ML (10ML) SYRINGE FOR IV PUSH (FOR BLOOD PRESSURE SUPPORT)
PREFILLED_SYRINGE | INTRAVENOUS | Status: AC
  Filled 2015-12-11: qty 10

## 2015-12-11 MED ORDER — ONDANSETRON HCL 4 MG/2ML IJ SOLN
INTRAMUSCULAR | Status: AC
  Filled 2015-12-11: qty 2

## 2015-12-11 MED ORDER — SCOPOLAMINE 1 MG/3DAYS TD PT72: 1.0000 | MEDICATED_PATCH | Freq: Once | TRANSDERMAL | Status: DC

## 2015-12-11 MED ORDER — SIMETHICONE 80 MG PO CHEW
80.0000 mg | CHEWABLE_TABLET | ORAL | Status: DC | PRN
  Administered 2015-12-12: 80 mg via ORAL

## 2015-12-11 MED ORDER — DIBUCAINE 1 % RE OINT: 1.0000 "application " | TOPICAL_OINTMENT | RECTAL | Status: DC | PRN

## 2015-12-11 MED ORDER — FENTANYL CITRATE (PF) 100 MCG/2ML IJ SOLN
INTRAMUSCULAR | Status: DC | PRN
  Administered 2015-12-11: 10 ug via INTRAVENOUS
  Administered 2015-12-11: 40 ug via INTRAVENOUS

## 2015-12-11 MED ORDER — OXYTOCIN 40 UNITS IN LACTATED RINGERS INFUSION - SIMPLE MED
INTRAVENOUS | Status: DC | PRN
  Administered 2015-12-11: 40 [IU] via INTRAVENOUS

## 2015-12-11 MED ORDER — ONDANSETRON HCL 4 MG/2ML IJ SOLN
INTRAMUSCULAR | Status: DC | PRN
  Administered 2015-12-11: 4 mg via INTRAVENOUS

## 2015-12-11 MED ORDER — BUPIVACAINE IN DEXTROSE 0.75-8.25 % IT SOLN
INTRATHECAL | Status: DC | PRN
  Administered 2015-12-11: 1.6 mL via INTRATHECAL

## 2015-12-11 MED ORDER — SENNOSIDES-DOCUSATE SODIUM 8.6-50 MG PO TABS
2.0000 | ORAL_TABLET | ORAL | Status: DC
  Administered 2015-12-12 – 2015-12-13 (×3): 2 via ORAL
  Filled 2015-12-11 (×3): qty 2

## 2015-12-11 MED ORDER — KETOROLAC TROMETHAMINE 30 MG/ML IJ SOLN
INTRAMUSCULAR | Status: AC
  Filled 2015-12-11: qty 1

## 2015-12-11 MED ORDER — OXYCODONE HCL 5 MG PO TABS
10.0000 mg | ORAL_TABLET | ORAL | Status: DC | PRN
  Administered 2015-12-12 – 2015-12-13 (×4): 10 mg via ORAL
  Filled 2015-12-11 (×4): qty 2

## 2015-12-11 SURGICAL SUPPLY — 31 items
CHLORAPREP W/TINT 26ML (MISCELLANEOUS) ×2 IMPLANT
CLAMP CORD UMBIL (MISCELLANEOUS) ×1 IMPLANT
CLOTH BEACON ORANGE TIMEOUT ST (SAFETY) ×2 IMPLANT
DRSG OPSITE POSTOP 4X10 (GAUZE/BANDAGES/DRESSINGS) ×2 IMPLANT
ELECT REM PT RETURN 9FT ADLT (ELECTROSURGICAL) ×2
ELECTRODE REM PT RTRN 9FT ADLT (ELECTROSURGICAL) ×1 IMPLANT
EXTRACTOR VACUUM M CUP 4 TUBE (SUCTIONS) IMPLANT
GLOVE BIO SURGEON STRL SZ7 (GLOVE) ×2 IMPLANT
GLOVE BIOGEL PI IND STRL 7.0 (GLOVE) ×1 IMPLANT
GLOVE BIOGEL PI INDICATOR 7.0 (GLOVE) ×1
GOWN STRL REUS W/TWL LRG LVL3 (GOWN DISPOSABLE) ×4 IMPLANT
KIT ABG SYR 3ML LUER SLIP (SYRINGE) IMPLANT
LIQUID BAND (GAUZE/BANDAGES/DRESSINGS) ×1 IMPLANT
NDL HYPO 25X5/8 SAFETYGLIDE (NEEDLE) IMPLANT
NEEDLE HYPO 22GX1.5 SAFETY (NEEDLE) IMPLANT
NEEDLE HYPO 25X5/8 SAFETYGLIDE (NEEDLE) IMPLANT
NS IRRIG 1000ML POUR BTL (IV SOLUTION) ×2 IMPLANT
PACK C SECTION WH (CUSTOM PROCEDURE TRAY) ×2 IMPLANT
PAD OB MATERNITY 4.3X12.25 (PERSONAL CARE ITEMS) ×2 IMPLANT
PENCIL SMOKE EVAC W/HOLSTER (ELECTROSURGICAL) ×2 IMPLANT
RTRCTR C-SECT PINK 25CM LRG (MISCELLANEOUS) ×2 IMPLANT
SUT CHROMIC 1 CTX 36 (SUTURE) ×4 IMPLANT
SUT CHROMIC 2 0 CT 1 (SUTURE) ×2 IMPLANT
SUT PDS AB 0 CTX 60 (SUTURE) ×2 IMPLANT
SUT PLAIN 2 0 XLH (SUTURE) ×1 IMPLANT
SUT VIC AB 2-0 CT1 27 (SUTURE) ×2
SUT VIC AB 2-0 CT1 TAPERPNT 27 (SUTURE) ×1 IMPLANT
SUT VIC AB 4-0 KS 27 (SUTURE) IMPLANT
SYR 30ML LL (SYRINGE) IMPLANT
TOWEL OR 17X24 6PK STRL BLUE (TOWEL DISPOSABLE) ×2 IMPLANT
TRAY FOLEY CATH SILVER 14FR (SET/KITS/TRAYS/PACK) ×2 IMPLANT

## 2015-12-11 NOTE — Anesthesia Procedure Notes (Signed)
Spinal  Patient location during procedure: OR Staffing Anesthesiologist: Dhriti Fales Performed: anesthesiologist  Preanesthetic Checklist Completed: patient identified, site marked, surgical consent, pre-op evaluation, timeout performed, IV checked, risks and benefits discussed and monitors and equipment checked Spinal Block Patient position: sitting Prep: ChloraPrep Patient monitoring: continuous pulse ox, blood pressure and heart rate Approach: midline Injection technique: single-shot Needle Needle type: Spinocan  Needle gauge: 24 G Needle length: 9 cm Additional Notes Functioning IV was confirmed and monitors were applied. Sterile prep and drape, including hand hygiene, mask and sterile gloves were used. The patient was positioned and the spine was prepped. The skin was anesthetized with lidocaine.  Free flow of clear CSF was obtained prior to injecting local anesthetic into the CSF.  The spinal needle aspirated freely following injection.  The needle was carefully withdrawn.  The patient tolerated the procedure well. Consent was obtained prior to procedure with all questions answered and concerns addressed. Risks including but not limited to bleeding, infection, nerve damage, paralysis, failed block, inadequate analgesia, allergic reaction, high spinal, itching and headache were discussed and the patient wished to proceed.   Sara Caprara, MD       

## 2015-12-11 NOTE — H&P (Signed)
Sara Clarke is a 25 y.o. female presenting for repeat cesarean section  25 yo G2P1001 @ 39+2 presents for repeat cesarean section  OB History    Gravida Para Term Preterm AB Living   2 1 1     1    SAB TAB Ectopic Multiple Live Births           1     Past Medical History:  Diagnosis Date  . Asthma   . Hypothyroidism   . PONV (postoperative nausea and vomiting)    nausea/vomiting with previous c section.   Past Surgical History:  Procedure Laterality Date  . CESAREAN SECTION     Family History: family history includes Hypertension in her father and mother; Thyroid disease in her mother. Social History:  reports that she has never smoked. She has never used smokeless tobacco. She reports that she does not drink alcohol or use drugs.     Maternal Diabetes: No Genetic Screening: Normal Maternal Ultrasounds/Referrals: Normal Fetal Ultrasounds or other Referrals:  None Maternal Substance Abuse:  No Significant Maternal Medications:  None Significant Maternal Lab Results:  None Other Comments:  None  ROS History   Blood pressure (!) 131/91, pulse 74, temperature 98 F (36.7 C), temperature source Oral, resp. rate 16, last menstrual period 03/11/2015, SpO2 100 %. Exam Physical Exam  Prenatal labs: ABO, Rh: --/--/O POS, O POS (10/24 1015) Antibody: NEG (10/24 1015) Rubella: Immune (06/08 0000) RPR: Non Reactive (10/24 1015)  HBsAg: Negative (06/08 0000)  HIV: Non-reactive (06/08 0000)  GBS: Negative (10/05 0000)   Assessment/Plan: 1) Admit 2) Consent for repeat cesarean section. R/B/A reviewed 3) SCDs for DVT prophylaxis 4) Ancef OCTOR   Jamarius Saha H. 12/11/2015, 11:57 AM

## 2015-12-11 NOTE — Addendum Note (Signed)
Addendum  created 12/11/15 1910 by Yolonda KidaAlison L Cerinity Zynda, CRNA   Sign clinical note

## 2015-12-11 NOTE — Anesthesia Postprocedure Evaluation (Signed)
Anesthesia Post Note  Patient: Sara Clarke  Procedure(s) Performed: Procedure(s) (LRB): CESAREAN SECTION (N/A)  Patient location during evaluation: Mother Baby Anesthesia Type: Spinal Level of consciousness: awake, awake and alert, oriented and patient cooperative Pain management: pain level controlled Vital Signs Assessment: post-procedure vital signs reviewed and stable Respiratory status: spontaneous breathing, nonlabored ventilation and respiratory function stable Cardiovascular status: stable Postop Assessment: patient able to bend at knees, no headache, no backache and no signs of nausea or vomiting Anesthetic complications: no     Last Vitals:  Vitals:   12/11/15 1645 12/11/15 1745  BP: 124/74 120/69  Pulse: 72 61  Resp: 18 18  Temp: 36.6 C 36.9 C    Last Pain:  Vitals:   12/11/15 1745  TempSrc: Oral  PainSc:    Pain Goal: Patients Stated Pain Goal: 3 (12/11/15 1645)               Johnelle Tafolla L

## 2015-12-11 NOTE — Op Note (Signed)
Pre-Operative Diagnosis: 1) 39+2 week intrauterine pregnancy 2) History of prior cesarean section, desires repeat Postoperative Diagnosis: 1) 39+2 week intrauterine pregnancy 2) History of prior cesarean section, desires repeat Procedure: Repeat cesarean section  Surgeon: Dr. Waynard ReedsKendra Avigayil Ton Assistant:  Operative Findings: Vigorous female infant in vertex presentation with apgars of 8 and 8. Normal ovaries and tubes Specimen: Placenta for disposal EBL: Total I/O In: 4400 [I.V.:4400] Out: 800 [Urine:100; Blood:700]   Procedure:Ms. Sara Clarke is an 25 year old gravida 2 para 1001 at 1839 weeks and 2 days estimated gestational age who presents for cesarean section. Following the appropriate informed consent the patient was brought to the operating room where spinal anesthesia was administered and found to be adequate. She was placed in the dorsal supine position with a leftward tilt. She was prepped and draped in the normal sterile fashion. Scalpel was then used to make a Pfannenstiel skin incision which was carried down to the underlying layers of soft tissue to the fascia. The fascia was incised in the midline and the fascial incision was extended laterally with Mayo scissors. The superior aspect of the fascial incision was grasped with Coker clamps x2, tented up and the rectus muscles dissected off sharply with the electrocautery unit area and the same procedure was repeated on the inferior aspect of the fascial incision. The rectus muscles were separated in the midline. The abdominal peritoneum was identified, tented up, entered sharply, and the incision was extended superiorly and inferiorly with good visualization of the bladder. The Alexis retractor was then deployed. The vesicouterine peritoneum was identified, tented up, entered sharply, and the bladder flap was created digitally. Scalpel was then used to make a low transverse incision on the uterus which was extended laterally with both blunt dissection and  the bandage scissors. The fetal vertex was identified, delivered easily through the uterine incision followed by the body. The infant was bulb suctioned on the operative field cried vigorously, cord was clamped and cut and the infant was passed to the waiting neonatologist. Placenta was then delivered spontaneously, the uterus was cleared of all clot and debris. The uterine incision was repaired with #1 chromic in running locked fashion followed by a second imbricating layer. Ovaries and tubes were inspected and normal. The Alexis retractor was removed. The uterus was returned to the abdominal cavity the abdominal cavity was cleared of all clot and debris. The abdominal peritoneum was reapproximated with 2-0 Vicryl in a running fashion, the rectus muscles was reapproximated with 2-0 chromic in a running fashion. The fascia was closed with a looped PDS in a running fashion. The skin was closed with 4-0 vicryl in a subcuticular fashion and surgical skin glue. All sponge lap and needle counts were correct x2. Patient tolerated the procedure well and recovered in stable condition following the procedure.

## 2015-12-11 NOTE — Anesthesia Postprocedure Evaluation (Signed)
Anesthesia Post Note  Patient: Sara Clarke  Procedure(s) Performed: Procedure(s) (LRB): CESAREAN SECTION (N/A)  Patient location during evaluation: PACU Anesthesia Type: Spinal Level of consciousness: oriented and awake and alert Pain management: pain level controlled Vital Signs Assessment: post-procedure vital signs reviewed and stable Respiratory status: spontaneous breathing, respiratory function stable and patient connected to nasal cannula oxygen Cardiovascular status: blood pressure returned to baseline and stable Postop Assessment: no headache, no backache, spinal receding and patient able to bend at knees Anesthetic complications: no     Last Vitals:  Vitals:   12/11/15 1445 12/11/15 1515  BP: 116/81 121/74  Pulse: (!) 58 67  Resp: 19 (!) 21  Temp:      Last Pain:  Vitals:   12/11/15 1515  TempSrc:   PainSc: 2    Pain Goal:                 [name]

## 2015-12-11 NOTE — Transfer of Care (Signed)
Immediate Anesthesia Transfer of Care Note  Patient: Sara Clarke  Procedure(s) Performed: Procedure(s): CESAREAN SECTION (N/A)  Patient Location: PACU  Anesthesia Type:Spinal  Level of Consciousness: awake, alert  and oriented  Airway & Oxygen Therapy: Patient Spontanous Breathing  Post-op Assessment: Report given to RN and Post -op Vital signs reviewed and stable  Post vital signs: Reviewed and stable  Last Vitals:  Vitals:   12/11/15 1108  BP: (!) 131/91  Pulse: 74  Resp: 16  Temp: 36.7 C    Last Pain:  Vitals:   12/11/15 1108  TempSrc: Oral         Complications: No apparent anesthesia complications

## 2015-12-12 LAB — CBC
HEMATOCRIT: 28.7 % — AB (ref 36.0–46.0)
Hemoglobin: 9.4 g/dL — ABNORMAL LOW (ref 12.0–15.0)
MCH: 26.4 pg (ref 26.0–34.0)
MCHC: 32.8 g/dL (ref 30.0–36.0)
MCV: 80.6 fL (ref 78.0–100.0)
PLATELETS: 185 10*3/uL (ref 150–400)
RBC: 3.56 MIL/uL — AB (ref 3.87–5.11)
RDW: 15.7 % — AB (ref 11.5–15.5)
WBC: 11.6 10*3/uL — AB (ref 4.0–10.5)

## 2015-12-12 LAB — BIRTH TISSUE RECOVERY COLLECTION (PLACENTA DONATION)

## 2015-12-12 NOTE — Progress Notes (Signed)
  Patient is eating, ambulating, voiding.  Pain control is good.  Vitals:   12/11/15 1745 12/11/15 1840 12/12/15 0030 12/12/15 0410  BP: 120/69 125/68 120/70 121/68  Pulse: 61 63 87 84  Resp: 18 18 20 20   Temp: 98.4 F (36.9 C) 98.3 F (36.8 C) 98.6 F (37 C) 98.6 F (37 C)  TempSrc: Oral Oral Oral Oral  SpO2: 99% 98%      lungs:   clear to auscultation cor:    RRR Abdomen:  soft, appropriate tenderness, incisions intact and without erythema or exudate ex:    no cords   Lab Results  Component Value Date   WBC 11.6 (H) 12/12/2015   HGB 9.4 (L) 12/12/2015   HCT 28.7 (L) 12/12/2015   MCV 80.6 12/12/2015   PLT 185 12/12/2015    --/--/O POS, O POS (10/24 1015)/RI  A/P    Post operative day 1.  Routine post op and postpartum care.  Expect d/c routine.  Percocet for pain control.

## 2015-12-13 NOTE — Progress Notes (Signed)
Patient is doing well.  She is tolerating PO, ambulating, voiding.  Lochia is appropriate Patient took only motrin for pain yesterday and last night and reports significant pain this AM.  Took oxycodone this AM and pain is starting to improve.    Vitals:   12/12/15 0410 12/12/15 1311 12/12/15 1743 12/13/15 0554  BP: 121/68 118/81 124/75 122/76  Pulse: 84 92 98 66  Resp: 20 20 18 18   Temp: 98.6 F (37 C) 98.5 F (36.9 C) 98.1 F (36.7 C) 98.3 F (36.8 C)  TempSrc: Oral Oral Oral Oral  SpO2:  100%      NAD Abdomen:  soft, appropriate tenderness, incisions intact and without erythema or drainage ext:    Symmetric, 2 edema bilaterally  Lab Results  Component Value Date   WBC 11.6 (H) 12/12/2015   HGB 9.4 (L) 12/12/2015   HCT 28.7 (L) 12/12/2015   MCV 80.6 12/12/2015   PLT 185 12/12/2015    --/--/O POS, O POS (10/24 1015)/RImmune  A/P    24 y.o. G2P2002 POD 2 s/p RCS Routine post op and postpartum care.   Sub optimal pain control--reviewed scheduled motrin, oxycodone prn.  Abdominal exam is benign. Will work to improve pain control today w planned discharge to home tomorrow.

## 2015-12-14 LAB — TYPE AND SCREEN
ABO/RH(D): O POS
ANTIBODY SCREEN: NEGATIVE
Unit division: 0
Unit division: 0

## 2015-12-14 MED ORDER — IBUPROFEN 600 MG PO TABS: 600.0000 mg | ORAL_TABLET | Freq: Four times a day (QID) | ORAL | 0 refills | Status: DC

## 2015-12-14 MED ORDER — OXYCODONE HCL 5 MG PO TABS: 5.0000 mg | ORAL_TABLET | ORAL | 0 refills | Status: DC | PRN

## 2015-12-14 NOTE — Discharge Instructions (Signed)
You may wash incision with soap and water.   Do not soak the incision for 2 weeks (no tub baths or swimming).   Keep incision dry. You may need to keep a sanitary pad or panty liner between the incision and your clothing for comfort and to keep the incision dry.  If you note drainage, increased pain, or increased redness of the incision, then please notify your physician.  Pelvic rest x 6 weeks (no intercourse or tampons)   No lifting over 10 lbs for 6 weeks.   Do not drive until you are not taking narcotic pain medication AND you can comfortably slam on the brakes.  You have a mild anemia due to blood loss from delivery.  Please continue to take a prenatal vitamin daily and add an iron supplement (one tablet daily).  You may experience constipation from the iron, so add a stool softener such as miralax as needed.

## 2015-12-14 NOTE — Discharge Summary (Signed)
Obstetric Discharge Summary Reason for Admission: cesarean section Prenatal Procedures: none Intrapartum Procedures: cesarean: low cervical, transverse Postpartum Procedures: none Complications-Operative and Postpartum: none Hemoglobin  Date Value Ref Range Status  12/12/2015 9.4 (L) 12.0 - 15.0 g/dL Final   HCT  Date Value Ref Range Status  12/12/2015 28.7 (L) 36.0 - 46.0 % Final    Physical Exam:  General: alert, cooperative and appears stated age 52Lochia: appropriate Uterine Fundus: firm Incision: healing well, no significant drainage, no dehiscence DVT Evaluation: No evidence of DVT seen on physical exam.  Discharge Diagnoses: Term Pregnancy-delivered  Discharge Information: Date: 12/14/2015 Activity: pelvic rest Diet: routine Medications: PNV and Percocet Condition: stable Instructions: refer to practice specific booklet Discharge to: home Follow-up Information    ROSS,KENDRA H., MD Follow up in 4 week(s).   Specialty:  Obstetrics and Gynecology Contact information: [address] [place] [address] [phone]           Newborn Data: Live born female  Birth Weight: 7 lb 5.3 oz (3325 g) APGAR: 8, 8  Home with mother.  [facility] Clarke Sara Clarke 12/14/2015, 8:49 AM

## 2015-12-14 NOTE — Progress Notes (Signed)
Patient is doing well.  She is tolerating PO, ambulating, voiding.  Lochia is appropriate Pain is well controlled    Vitals:   12/13/15 0554 12/13/15 1700 12/13/15 1856 12/14/15 0705  BP: 122/76  124/77 (!) 134/91  Pulse: 66  79 78  Resp: 18  18 18   Temp: 98.3 F (36.8 C)  99.5 F (37.5 C) 98 F (36.7 C)  TempSrc: Oral  Oral Oral  SpO2:      Weight:  91.6 kg (202 lb)    Height:  5\' 1"  (1.549 m)      NAD Abdomen:  soft, appropriate tenderness, incisions intact and without erythema or drainage ext:    Symmetric, 2 edema bilaterally  Lab Results  Component Value Date   WBC 11.6 (H) 12/12/2015   HGB 9.4 (L) 12/12/2015   HCT 28.7 (L) 12/12/2015   MCV 80.6 12/12/2015   PLT 185 12/12/2015    --/--/O POS, O POS (10/24 1015)/RImmune  A/P    24 y.o. G2P2002 POD 3 s/p RCS Routine post op and postpartum care.   Meeting all goals--d/c to home today

## 2016-01-14 DIAGNOSIS — R5383 Other fatigue: Secondary | ICD-10-CM | POA: Diagnosis not present

## 2016-03-13 ENCOUNTER — Encounter: Payer: Self-pay | Admitting: Family

## 2016-03-13 ENCOUNTER — Ambulatory Visit (INDEPENDENT_AMBULATORY_CARE_PROVIDER_SITE_OTHER): Payer: BLUE CROSS/BLUE SHIELD | Admitting: Family

## 2016-03-13 VITALS — BP 122/78 | HR 73 | Temp 98.9°F | Ht 61.0 in | Wt 182.0 lb

## 2016-03-13 DIAGNOSIS — J309 Allergic rhinitis, unspecified: Secondary | ICD-10-CM

## 2016-03-13 DIAGNOSIS — M546 Pain in thoracic spine: Secondary | ICD-10-CM

## 2016-03-13 MED ORDER — CYCLOBENZAPRINE HCL 5 MG PO TABS: 5.0000 mg | ORAL_TABLET | Freq: Three times a day (TID) | ORAL | 1 refills | Status: DC | PRN

## 2016-03-13 MED ORDER — NAPROXEN 500 MG PO TABS: 500.0000 mg | ORAL_TABLET | Freq: Two times a day (BID) | ORAL | 1 refills | Status: DC

## 2016-03-13 MED ORDER — CETIRIZINE HCL 10 MG PO TABS: 10.0000 mg | ORAL_TABLET | Freq: Every day | ORAL | 11 refills | Status: DC

## 2016-03-13 MED ORDER — FLUTICASONE PROPIONATE 50 MCG/ACT NA SUSP: 2.0000 | Freq: Every day | NASAL | 6 refills | Status: DC

## 2016-03-13 NOTE — Patient Instructions (Signed)
Thoracic Strain Thoracic strain is an injury to the muscles or tendons that attach to the upper back. A strain can be mild or severe. A mild strain may take only 1-2 weeks to heal. A severe strain involves torn muscles or tendons, so it may take 6-8 weeks to heal. Follow these instructions at home:  Rest as needed. Limit your activity as told by your doctor.  If directed, put ice on the injured area:  Put ice in a plastic bag.  Place a towel between your skin and the bag.  Leave the ice on for 20 minutes, 2-3 times per day.  Take over-the-counter and prescription medicines only as told by your doctor.  Begin doing exercises as told by your doctor or physical therapist.  Warm up before being active.  Bend your knees before you lift heavy objects.  Keep all follow-up visits as told by your doctor. This is important. Contact a doctor if:  Your pain is not helped by medicine.  Your pain, bruising, or swelling is getting worse.  You have a fever. Get help right away if:  You have shortness of breath.  You have chest pain.  You have weakness or loss of feeling (numbness) in your legs.  You cannot control when you pee (urinate). This information is not intended to replace advice given to you by your health care provider. Make sure you discuss any questions you have with your health care provider. Document Released: 07/22/2007 Document Revised: 10/05/2015 Document Reviewed: 03/29/2014 Elsevier Interactive Patient Education  2017 Elsevier Inc.  

## 2016-03-13 NOTE — Progress Notes (Signed)
   Subjective:    Patient ID: Sara Clarke, female    DOB: 12/15/1990, 26 y.o.   MRN: [id number]  Cough  This is a new problem. The current episode started yesterday. The problem has been gradually worsening. The problem occurs every few minutes. The cough is non-productive.  Back Pain  This is a new problem. The current episode started in the past 7 days. The problem occurs intermittently. The problem is unchanged. The pain is present in the thoracic spine. The quality of the pain is described as aching. The pain is at a severity of 3/10. The pain is moderate. The symptoms are aggravated by lying down. Associated symptoms include tingling. Pertinent negatives include no bladder incontinence, bowel incontinence, dysuria or leg pain. She has tried bed rest and NSAIDs for the symptoms. The treatment provided moderate relief.      Review of Systems  Respiratory: Positive for cough.   Gastrointestinal: Negative for bowel incontinence.  Genitourinary: Negative for bladder incontinence and dysuria.  Musculoskeletal: Positive for back pain.  Neurological: Positive for tingling.  All other systems reviewed and are negative.      Objective:   Physical Exam  Constitutional: She is oriented to person, place, and time. She appears well-developed and well-nourished. No distress.  HENT:  Head: Normocephalic and atraumatic.  Right Ear: External ear normal.  Left Ear: External ear normal.  Nose: Mucosal edema and rhinorrhea present.  Mouth/Throat: Posterior oropharyngeal erythema present.  Tonsil enlarged 2+  Eyes: Pupils are equal, round, and reactive to light.  Neck: Normal range of motion. Neck supple. No thyromegaly present.  Cardiovascular: Normal rate, regular rhythm, normal heart sounds and intact distal pulses.   No murmur heard. Pulmonary/Chest: Effort normal and breath sounds normal. No respiratory distress. She has no wheezes.  Abdominal: Soft. Bowel sounds are normal. She exhibits  no distension. There is no tenderness.  Musculoskeletal: Normal range of motion. She exhibits tenderness (in thoracic area with bending and twisting). She exhibits no edema.  Neurological: She is alert and oriented to person, place, and time. She has normal reflexes. No cranial nerve deficit.  Skin: Skin is warm and dry.  Psychiatric: She has a normal mood and affect. Her behavior is normal. Judgment and thought content normal.  Vitals reviewed.     BP 122/78   Pulse 73   Temp 98.9 F (37.2 C) (Oral)   Ht 5\' 1"  (1.549 m)   Wt 182 lb (82.6 kg)   BMI 34.39 kg/m      Assessment & Plan:  1. Acute bilateral thoracic back pain -Rest -Ice and heat -ROM exercises discussed RTO prn  - naproxen (NAPROSYN) 500 MG tablet; Take 1 tablet (500 mg total) by mouth 2 (two) times daily with a meal.  Dispense: 60 tablet; Refill: 1 - cyclobenzaprine (FLEXERIL) 5 MG tablet; Take 1 tablet (5 mg total) by mouth 3 (three) times daily as needed for muscle spasms.  Dispense: 60 tablet; Refill: 1  2. Allergic rhinitis, unspecified chronicity, unspecified seasonality, unspecified trigger -Avoid allergens  Start flonase and zyrtec - fluticasone (FLONASE) 50 MCG/ACT nasal spray; Place 2 sprays into both nostrils daily.  Dispense: 16 g; Refill: 6 - cetirizine (ZYRTEC) 10 MG tablet; Take 1 tablet (10 mg total) by mouth daily.  Dispense: 30 tablet; Refill: 11  Jannifer Rodneyhristy Hawks, FNP

## 2016-04-02 ENCOUNTER — Ambulatory Visit (INDEPENDENT_AMBULATORY_CARE_PROVIDER_SITE_OTHER): Payer: BLUE CROSS/BLUE SHIELD | Admitting: Pediatrics

## 2016-04-02 ENCOUNTER — Encounter: Payer: Self-pay | Admitting: Pediatrics

## 2016-04-02 VITALS — BP 126/85 | HR 81 | Temp 98.5°F | Ht 61.0 in | Wt 186.0 lb

## 2016-04-02 DIAGNOSIS — J029 Acute pharyngitis, unspecified: Secondary | ICD-10-CM

## 2016-04-02 DIAGNOSIS — J020 Streptococcal pharyngitis: Secondary | ICD-10-CM | POA: Diagnosis not present

## 2016-04-02 LAB — RAPID STREP SCREEN (MED CTR MEBANE ONLY): Strep Gp A Ag, IA W/Reflex: POSITIVE — AB

## 2016-04-02 MED ORDER — AMOXICILLIN 500 MG PO CAPS: 500.0000 mg | ORAL_CAPSULE | Freq: Two times a day (BID) | ORAL | 0 refills | Status: DC

## 2016-04-02 NOTE — Patient Instructions (Signed)
Netipot with distilled water 2-3 times a day to clear out sinuses Or Normal saline nasal spray Flonase steroid nasal spray Antihistamine daily such as cetirizine Ibuprofen 600mg three times a day Lots of fluids  

## 2016-04-02 NOTE — Progress Notes (Signed)
  Subjective:   Patient ID: Sara Clarke, female    DOB: 01/16/1991, 26 y.o.   MRN: [id number] CC: Cough (started Monday, getting worse, non productive); Headache; Chest Pain; Chills; and Sore Throat  HPI: Sara Clarke is a 26 y.o. female presenting for Cough (started Monday, getting worse, non productive); Headache; Chest Pain; Chills; and Sore Throat  Doesn't think she has had any fevers Has been checking at home Feels cold and sweaty Throat bothering her the most Chest hurts when she coughs Appetite down  Relevant past medical, surgical, family and social history reviewed. Allergies and medications reviewed and updated. History  Smoking Status  . Never Smoker  Smokeless Tobacco  . Never Used   ROS: Per HPI   Objective:    BP 126/85   Pulse 81   Temp 98.5 F (36.9 C) (Oral)   Ht 5\' 1"  (1.549 m)   Wt 186 lb (84.4 kg)   BMI 35.14 kg/m   Wt Readings from Last 3 Encounters:  04/02/16 186 lb (84.4 kg)  03/13/16 182 lb (82.6 kg)  12/13/15 202 lb (91.6 kg)    Gen: NAD, alert, cooperative with exam, NCAT EYES: EOMI, no conjunctival injection, or no icterus ENT:  TMs pearly gray b/l, OP with mild erythema, generous tonsils LYMPH: no cervical LAD CV: NRRR, normal S1/S2, no murmur Resp: CTABL, no wheezes, normal WOB Abd: +BS, soft, NTND.  Neuro: Alert and oriented  Assessment & Plan:  Sara Clarke was seen today for cough, headache, chest pain, chills and sore throat.  Diagnoses and all orders for this visit:  Sore throat -     [facility])  Strep throat Strep test positive Start below Discussed symptom care -     amoxicillin (AMOXIL) 500 MG capsule; Take 1 capsule (500 mg total) by mouth 2 (two) times daily.   Follow up plan: prn Rex Krasarol Vincent, MD [facility]

## 2016-04-22 ENCOUNTER — Telehealth: Payer: Self-pay | Admitting: Family

## 2016-04-22 NOTE — Telephone Encounter (Signed)
Have her call in if she develops any symptoms, it is not necessary to give prophylaxis 2 young healthy adults. If she develops any fevers or chills or cough or congestion or anything along those lines give us a call

## 2016-04-23 MED ORDER — OSELTAMIVIR PHOSPHATE 75 MG PO CAPS: 75.0000 mg | ORAL_CAPSULE | Freq: Every day | ORAL | 0 refills | Status: DC

## 2016-04-23 NOTE — Telephone Encounter (Signed)
Rx sent to pharmacy   

## 2016-04-23 NOTE — Telephone Encounter (Signed)
Fine go ahead and send in tamiflu 75 mg daily 10 days

## 2016-04-23 NOTE — Telephone Encounter (Signed)
Dr. Louanne Skyeettinger, the father (her husband) sees Dr. Oswaldo DoneVincent and she sent over Tamiflu, didn't know if you wanted to send it in for her since the dad is getting a rx.

## 2016-04-25 ENCOUNTER — Ambulatory Visit (INDEPENDENT_AMBULATORY_CARE_PROVIDER_SITE_OTHER): Payer: BLUE CROSS/BLUE SHIELD | Admitting: Physician Assistant

## 2016-04-25 ENCOUNTER — Encounter: Payer: Self-pay | Admitting: Physician Assistant

## 2016-04-25 VITALS — BP 124/84 | HR 99 | Temp 100.3°F | Ht 61.0 in | Wt 183.0 lb

## 2016-04-25 DIAGNOSIS — J111 Influenza due to unidentified influenza virus with other respiratory manifestations: Secondary | ICD-10-CM | POA: Diagnosis not present

## 2016-04-25 DIAGNOSIS — J029 Acute pharyngitis, unspecified: Secondary | ICD-10-CM | POA: Diagnosis not present

## 2016-04-25 NOTE — Patient Instructions (Signed)

## 2016-04-25 NOTE — Progress Notes (Signed)
BP 124/84   Pulse 99   Temp 100.3 F (37.9 C) (Oral)   Ht 5\' 1"  (1.549 m)   Wt 183 lb (83 kg)   BMI 34.58 kg/m    Subjective:    Patient ID: Sara Clarke, female    DOB: 01-25-1991, 26 y.o.   MRN: [id number]  HPI: Sara Clarke is a 26 y.o. female presenting on 04/25/2016 for Sore Throat; Fever; and Generalized Body Aches  This patient has had less than 2 days severe fever, chills, myalgias.  Complains of sinus headache and postnasal drainage. There is copious drainage at times. Associated sore throat, decreased appetite and headache.  Has been exposed to influenza. Child was diagnosed. Med was sent for her on 04/23/16 to start.   Relevant past medical, surgical, family and social history reviewed and updated as indicated. Allergies and medications reviewed and updated.  Past Medical History:  Diagnosis Date  . Asthma   . Hypothyroidism   . PONV (postoperative nausea and vomiting)    nausea/vomiting with previous c section.    Past Surgical History:  Procedure Laterality Date  . CESAREAN SECTION    . CESAREAN SECTION N/A 12/11/2015   Procedure: CESAREAN SECTION;  Surgeon: Waynard Reeds, MD;  Location: [facility] BIRTHING SUITES;  Service: Obstetrics;  Laterality: N/A;    Review of Systems  Constitutional: Positive for appetite change, chills, fatigue and fever. Negative for activity change.  HENT: Positive for congestion, postnasal drip and sore throat.   Eyes: Negative.   Respiratory: Negative for cough and wheezing.   Cardiovascular: Negative.  Negative for chest pain, palpitations and leg swelling.  Gastrointestinal: Negative.   Genitourinary: Negative.   Musculoskeletal: Positive for myalgias.  Skin: Negative.   Neurological: Positive for headaches.    Allergies as of 04/25/2016   No Known Allergies     Medication List       Accurate as of 04/25/16 11:28 AM. Always use your most recent med list.          cetirizine 10 MG tablet Commonly known as:  ZYRTEC Take  1 tablet (10 mg total) by mouth daily.   fluticasone 50 MCG/ACT nasal spray Commonly known as:  FLONASE Place 2 sprays into both nostrils daily.   ibuprofen 600 MG tablet Commonly known as:  ADVIL,MOTRIN Take 1 tablet (600 mg total) by mouth every 6 (six) hours.   oseltamivir 75 MG capsule Commonly known as:  TAMIFLU Take 1 capsule (75 mg total) by mouth daily.          Objective:    BP 124/84   Pulse 99   Temp 100.3 F (37.9 C) (Oral)   Ht 5\' 1"  (1.549 m)   Wt 183 lb (83 kg)   BMI 34.58 kg/m   No Known Allergies  Physical Exam  Constitutional: She is oriented to person, place, and time. She appears well-developed and well-nourished. No distress.  HENT:  Head: Normocephalic and atraumatic.  Right Ear: Tympanic membrane normal.  Left Ear: Tympanic membrane normal.  Nose: Mucosal edema and rhinorrhea present. Right sinus exhibits no frontal sinus tenderness. Left sinus exhibits no frontal sinus tenderness.  Mouth/Throat: Posterior oropharyngeal erythema present. No oropharyngeal exudate or tonsillar abscesses.  Eyes: Conjunctivae and EOM are normal. Pupils are equal, round, and reactive to light.  Neck: Normal range of motion.  Cardiovascular: Normal rate, regular rhythm, normal heart sounds, intact distal pulses and normal pulses.   Pulmonary/Chest: Effort normal and breath sounds normal. No respiratory distress.  Abdominal: Soft. Bowel sounds are normal.  Neurological: She is alert and oriented to person, place, and time. She has normal reflexes.  Skin: Skin is warm and dry. No rash noted.  Psychiatric: She has a normal mood and affect. Her behavior is normal. Judgment and thought content normal.  Nursing note and vitals reviewed.   Results for orders placed or performed in visit on 04/02/16  R[facility] screen ([facility])  Result Value Ref Range   Strep Gp A Ag, IA W/Reflex Positive (A) Negative      Assessment & Plan:   1. Sore throat - Rapid strep screen  (not [facility]) negative  2. Influenza Tamiflu 75 mg BID, started yesterday Note to be out 3/10-3/17 due to flu   Continue all other maintenance medications as listed above.  Follow up plan: Return if symptoms worsen or fail to improve.  Educational handout given for influenza  Remus LofflerAngel S. Cloie Wooden PA-C [facility] [address]  [place], [place] [address] [phone]   04/25/2016, 11:28 AM

## 2016-04-27 LAB — CULTURE, GROUP A STREP

## 2016-04-27 LAB — RAPID STREP SCREEN (MED CTR MEBANE ONLY): Strep Gp A Ag, IA W/Reflex: NEGATIVE

## 2016-06-16 IMAGING — MR MR ANKLE*R* W/O CM
5 series · 40 of 40 positions shown · non-contrast
Comparison: Radiographs 07/11/2014

CLINICAL DATA: Injured ankle 6 weeks ago. Persistent medial and
lateral ankle pain.

EXAM:
MRI OF THE RIGHT ANKLE WITHOUT CONTRAST
TECHNIQUE: Multiplanar, multisequence MR imaging of the ankle was performed. No
intravenous contrast was administered.

[Series 4: PD fat-sat · axial · 3.0mm · 0.50mm/px · z∈[-108,+20]mm · 10 of 40 slices shown]
[im 1/40]
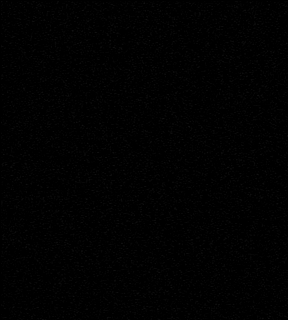
[im 5/40]
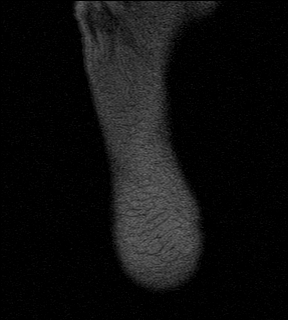
[im 9/40]
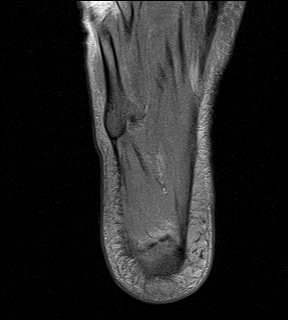
[im 14/40]
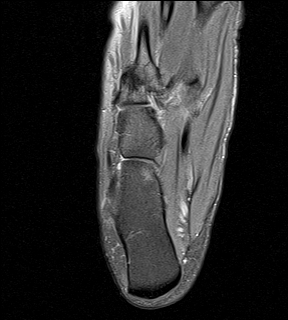
[im 18/40]
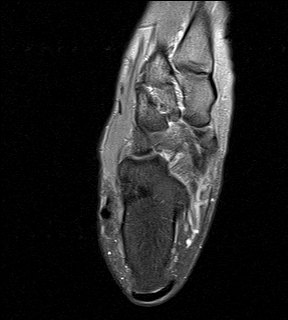
[im 22/40]
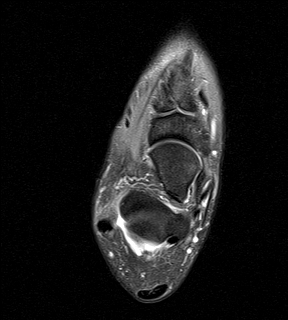
[im 27/40]
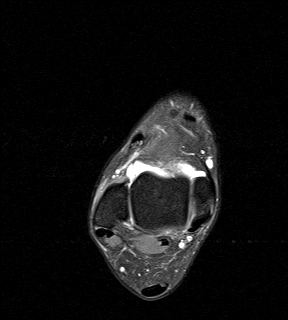
[im 31/40]
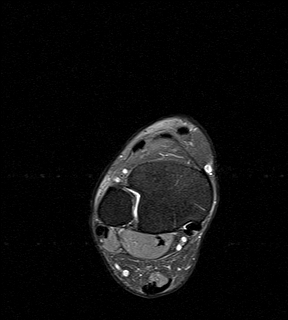
[im 35/40]
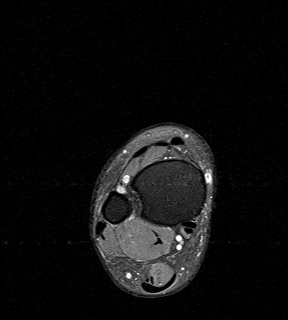
[im 40/40]
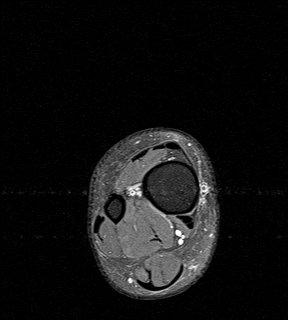

[Series 5: T2 fat-sat · axial · 3.0mm · 0.50mm/px · z∈[-108,+20]mm · 9 of 40 slices shown (1 of 3)]
[im 1/40]
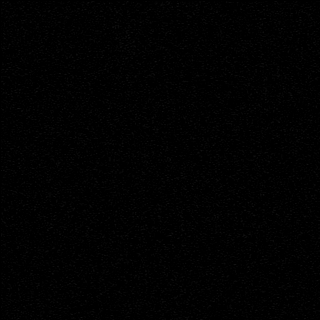
[im 5/40]
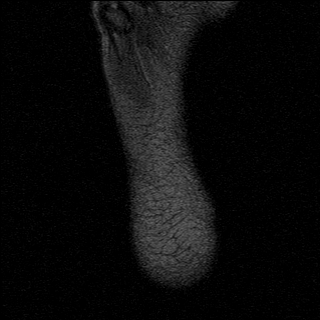
[im 10/40]
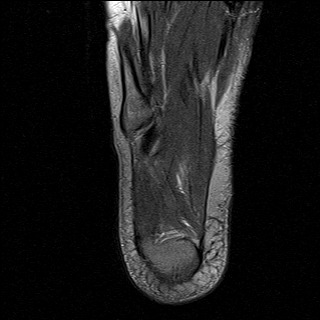
[im 15/40]
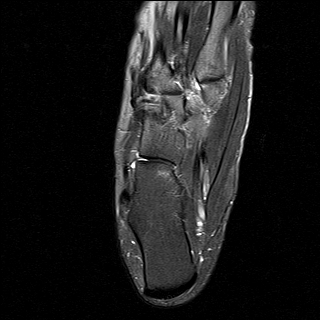
[im 20/40]
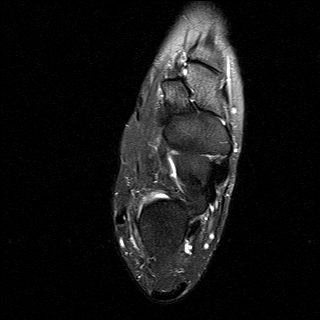
[im 25/40]
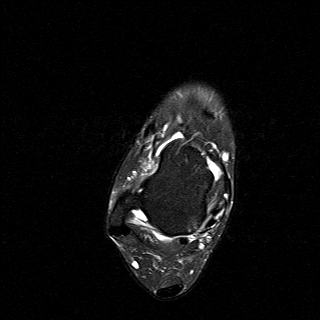
[im 30/40]
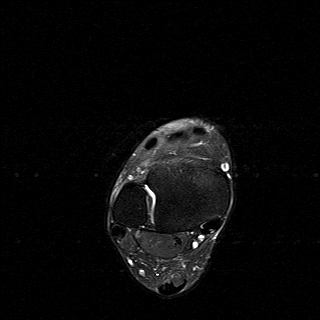
[im 35/40]
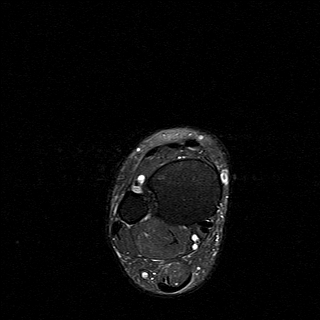
[im 40/40]
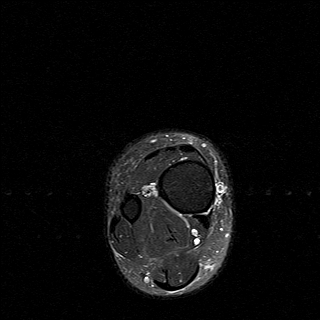

[Series 6: T2 fat-sat · coronal · 3.0mm · 0.56mm/px · 9 of 41 slices shown (2 of 3)]
[im 1/41]
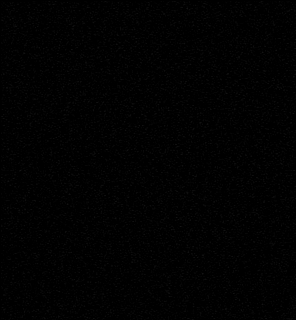
[im 6/41]
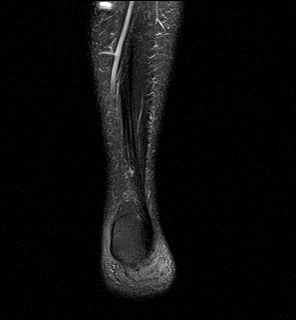
[im 11/41]
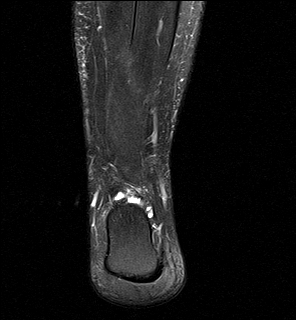
[im 16/41]
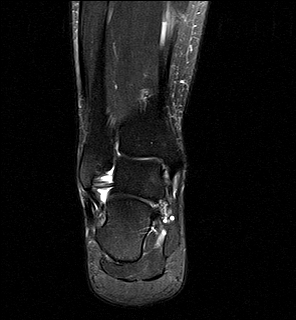
[im 21/41]
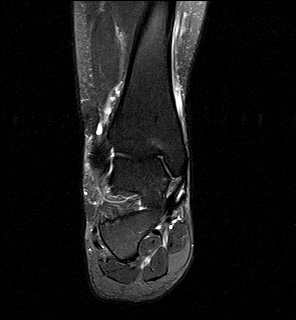
[im 26/41]
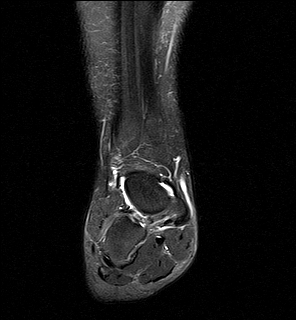
[im 31/41]
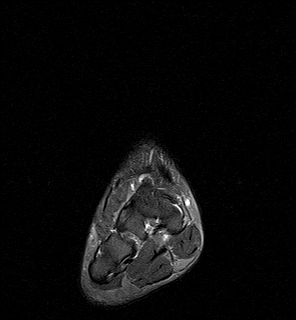
[im 36/41]
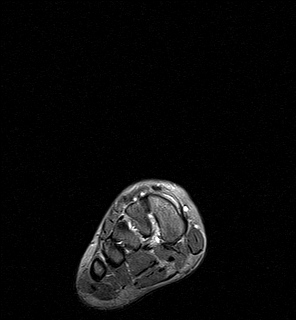
[im 41/41]
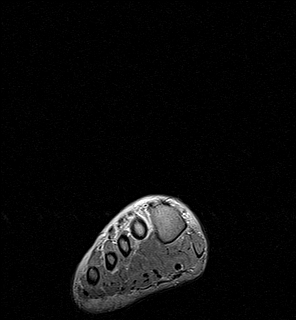

[Series 7: T1 · sagittal · 3.0mm · 0.47mm/px · 6 of 27 slices shown]
[im 1/27]
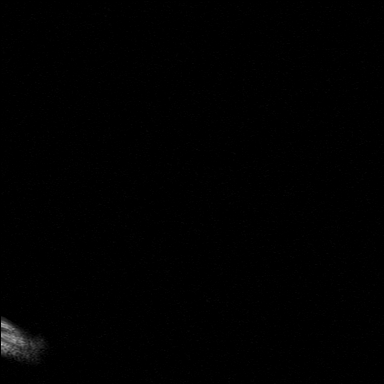
[im 6/27]
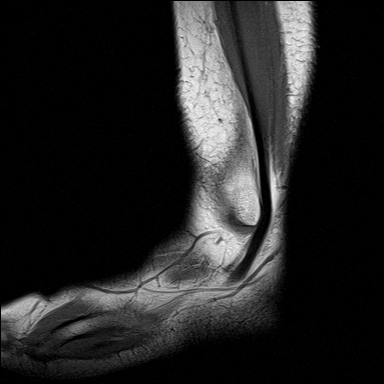
[im 11/27]
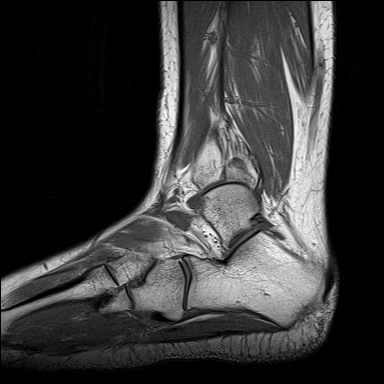
[im 16/27]
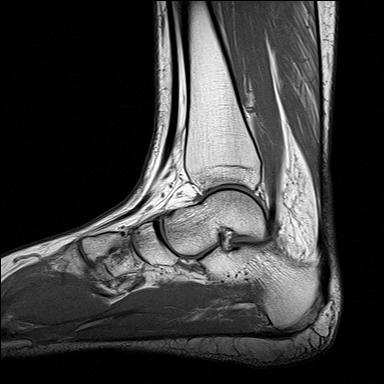
[im 21/27]
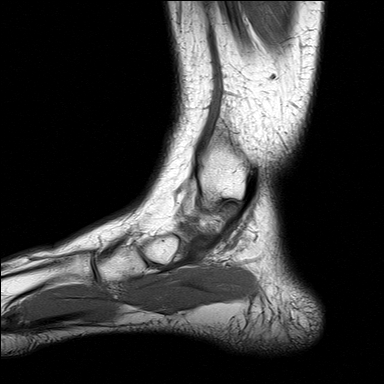
[im 27/27]
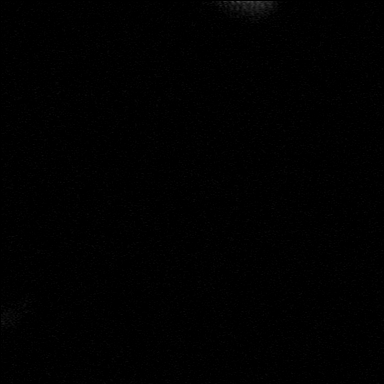

[Series 8: T2 fat-sat · sagittal · 3.0mm · 0.56mm/px · 6 of 27 slices shown (3 of 3)]
[im 1/27]
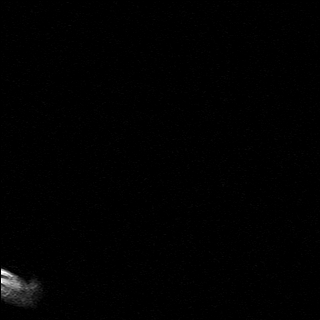
[im 6/27]
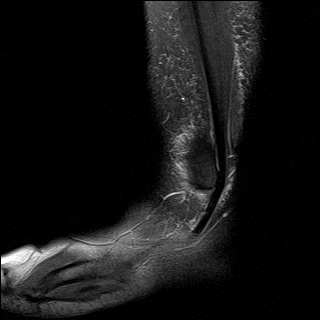
[im 11/27]
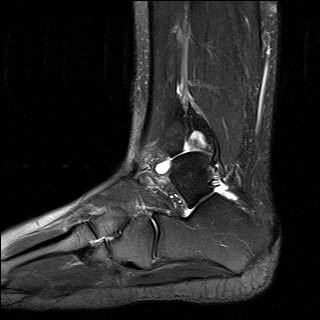
[im 16/27]
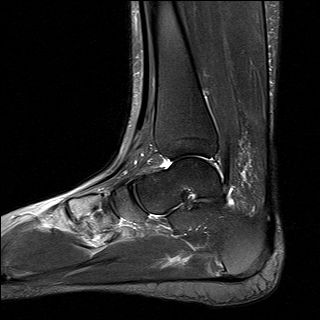
[im 21/27]
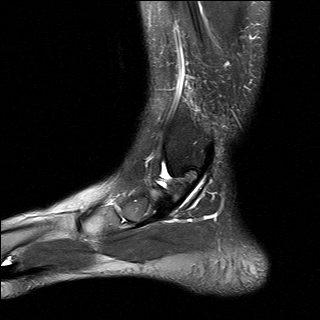
[im 27/27]
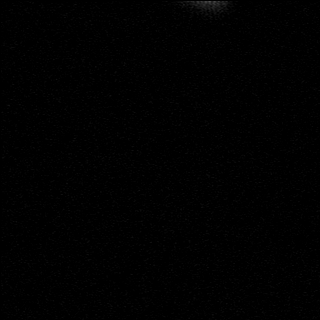

[40 of 40 positions shown; findings below may reference images not displayed]

FINDINGS: TENDONS

Peroneal: Intact.

Posteromedial: Intact.

Anterior: Intact.

Achilles: Normal.

Plantar Fascia: Intact.

LIGAMENTS

Lateral: Partial thickness tearing of the anterior tibiofibular
ligament. The inferior fibers appear torn from the tibial attachment
site. The posterior tibiofibular ligament is intact. There is a
high-grade partial tear of the anterior talofibular ligament. The
posterior talofibular ligament is intact. The calcaneal fibular
ligament is intact.

Medial: Deltoid ligament complex is intact.

CARTILAGE

Ankle Joint: No cartilage defects or osteochondral lesion. There is
a small to moderate-sized tibiotalar joint effusion which could be
posttraumatic synovitis.

Subtalar Joints/Sinus Tarsi: Small subtalar joint effusions are
noted. No cartilage lesions. The sinus tarsi is normal. The cervical
and interosseous ligaments are intact. The spring ligament is
intact.

Bones: No fracture or osteochondral abnormality.
IMPRESSION: 1. Partial thickness tearing of the anterior tibiofibular ligament
and anterior talofibular ligament.
2. Intact medial and lateral ankle tendons.
3. Tibiotalar and subtalar joint effusions may be due to
posttraumatic synovitis. No cartilage defects or osteochondral
lesions.
4. No acute bony findings.

## 2016-09-29 DIAGNOSIS — M722 Plantar fascial fibromatosis: Secondary | ICD-10-CM | POA: Diagnosis not present

## 2016-09-29 DIAGNOSIS — M79672 Pain in left foot: Secondary | ICD-10-CM | POA: Diagnosis not present

## 2016-10-20 DIAGNOSIS — M722 Plantar fascial fibromatosis: Secondary | ICD-10-CM | POA: Diagnosis not present

## 2016-10-28 ENCOUNTER — Ambulatory Visit (INDEPENDENT_AMBULATORY_CARE_PROVIDER_SITE_OTHER): Payer: BLUE CROSS/BLUE SHIELD | Admitting: Nurse Practitioner

## 2016-10-28 ENCOUNTER — Encounter: Payer: Self-pay | Admitting: Nurse Practitioner

## 2016-10-28 VITALS — BP 119/80 | HR 97 | Temp 98.1°F | Ht 61.0 in | Wt 186.0 lb

## 2016-10-28 DIAGNOSIS — R519 Headache, unspecified: Secondary | ICD-10-CM

## 2016-10-28 DIAGNOSIS — R51 Headache: Secondary | ICD-10-CM | POA: Diagnosis not present

## 2016-10-28 DIAGNOSIS — R5383 Other fatigue: Secondary | ICD-10-CM | POA: Diagnosis not present

## 2016-10-28 DIAGNOSIS — W57XXXA Bitten or stung by nonvenomous insect and other nonvenomous arthropods, initial encounter: Secondary | ICD-10-CM

## 2016-10-28 MED ORDER — DOXYCYCLINE HYCLATE 100 MG PO TABS: 100.0000 mg | ORAL_TABLET | Freq: Two times a day (BID) | ORAL | 0 refills | Status: DC

## 2016-10-28 NOTE — Progress Notes (Signed)
   Subjective:    Patient ID: Sara Clarke, female    DOB: 03/02/1990, 26 y.o.   MRN: [id number]  HPI Patient comes in saying that she removed a tick off of her right flank 2 days ago- not sure how long it was there. Now she is achy all over and has a bad headache. Said she felt hot last night.    Review of Systems  Constitutional: Positive for fatigue and fever (?). Negative for appetite change and chills.  HENT: Negative.   Respiratory: Negative.   Cardiovascular: Negative.   Gastrointestinal: Positive for nausea. Negative for constipation, diarrhea and vomiting.  Musculoskeletal: Positive for arthralgias.  Neurological: Positive for dizziness and headaches.  Psychiatric/Behavioral: Negative.   All other systems reviewed and are negative.      Objective:   Physical Exam  Constitutional: She is oriented to person, place, and time. She appears well-developed and well-nourished. No distress.  Cardiovascular: Normal rate and regular rhythm.   Pulmonary/Chest: Effort normal.  Neurological: She is alert and oriented to person, place, and time.  Skin: Skin is warm.  Small erythematous area on right flank where tick was removed  Psychiatric: She has a normal mood and affect. Her behavior is normal. Judgment and thought content normal.   BP 119/80   Pulse 97   Temp 98.1 F (36.7 C) (Oral)   Ht 5\' 1"  (1.549 m)   Wt 186 lb (84.4 kg)   BMI 35.14 kg/m        Assessment & Plan:   1. Tick bite, initial encounter   2. Other fatigue   3. Acute nonintractable headache, unspecified headache type    . doxycycline (VIBRA-TABS) 100 MG tablet    Sig: Take 1 tablet (100 mg total) by mouth 2 (two) times daily. 1 po bid    Dispense:  28 tablet    Refill:  0    Order Specific Question:   Supervising Provider    Answer:   Johna SheriffVINCENT, CAROL L [[id number]]    Force fluids Rest Take antibiotics for 14 days RTO  Prn  Mary-Margaret Daphine DeutscherMartin, FNP

## 2016-10-28 NOTE — Patient Instructions (Signed)
Tick Bite Information Introduction Ticks are insects that attach themselves to the skin. There are many types of ticks. Common types include wood ticks and deer ticks. Sometimes, ticks carry diseases that can make a person very ill. The most common places for ticks to attach themselves are the scalp, neck, armpits, waist, and groin. HOW CAN YOU PREVENT TICK BITES? Take these steps to help prevent tick bites when you are outdoors:  Wear long sleeves and long pants.  Wear white clothes so you can see ticks more easily.  Tuck your pant legs into your socks.  If walking on a trail, stay in the middle of the trail to avoid brushing against bushes.  Avoid walking through areas with long grass.  Put bug spray on all skin that is showing and along boot tops, pant legs, and sleeve cuffs.  Check clothes, hair, and skin often and before going inside.  Brush off any ticks that are not attached.  Take a shower or bath as soon as possible after being outdoors.  HOW SHOULD YOU REMOVE A TICK? Ticks should be removed as soon as possible to help prevent diseases. 1. If latex gloves are available, put them on before trying to remove a tick. 2. Use tweezers to grasp the tick as close to the skin as possible. You may also use curved forceps or a tick removal tool. Grasp the tick as close to its head as possible. Avoid grasping the tick on its body. 3. Pull gently upward until the tick lets go. Do not twist the tick or jerk it suddenly. This may break off the tick's head or mouth parts. 4. Do not squeeze or crush the tick's body. This could force disease-carrying fluids from the tick into your body. 5. After the tick is removed, wash the bite area and your hands with soap and water or alcohol. 6. Apply a small amount of antiseptic cream or ointment to the bite site. 7. Wash any tools that were used.  Do not try to remove a tick by applying a hot match, petroleum jelly, or fingernail polish to the tick.  These methods do not work. They may also increase the chances of disease being spread from the tick bite. WHEN SHOULD YOU SEEK HELP? Contact your health care provider if you are unable to remove a tick or if a part of the tick breaks off in the skin. After a tick bite, you need to watch for signs and symptoms of diseases that can be spread by ticks. Contact your health care provider if you develop any of the following:  Fever.  Rash.  Redness and puffiness (swelling) in the area of the tick bite.  Tender, puffy lymph glands.  Watery poop (diarrhea).  Weight loss.  Cough.  Feeling more tired than normal (fatigue).  Muscle, joint, or bone pain.  Belly (abdominal) pain.  Headache.  Change in your level of consciousness.  Trouble walking or moving your legs.  Loss of feeling (numbness) in the legs.  Loss of movement (paralysis).  Shortness of breath.  Confusion.  Throwing up (vomiting) many times.  This information is not intended to replace advice given to you by your health care provider. Make sure you discuss any questions you have with your health care provider. Document Released: 04/29/2009 Document Revised: 07/11/2015 Document Reviewed: 07/13/2012 Elsevier Interactive Patient Education  2018 Elsevier Inc.  

## 2016-11-23 ENCOUNTER — Ambulatory Visit (INDEPENDENT_AMBULATORY_CARE_PROVIDER_SITE_OTHER): Payer: BLUE CROSS/BLUE SHIELD | Admitting: Family

## 2016-11-23 ENCOUNTER — Encounter: Payer: Self-pay | Admitting: Family

## 2016-11-23 VITALS — BP 125/83 | HR 85 | Temp 98.4°F | Ht 61.0 in | Wt 186.0 lb

## 2016-11-23 DIAGNOSIS — B372 Candidiasis of skin and nail: Secondary | ICD-10-CM

## 2016-11-23 MED ORDER — NYSTATIN 100000 UNIT/GM EX POWD: Freq: Four times a day (QID) | CUTANEOUS | 0 refills | Status: DC

## 2016-11-23 MED ORDER — NYSTATIN-TRIAMCINOLONE 100000-0.1 UNIT/GM-% EX OINT: 1.0000 "application " | TOPICAL_OINTMENT | Freq: Two times a day (BID) | CUTANEOUS | 0 refills | Status: DC

## 2016-11-23 NOTE — Patient Instructions (Signed)
Skin Yeast Infection Skin yeast infection is a condition in which there is an overgrowth of yeast (candida) that normally lives on the skin. This condition usually occurs in areas of the skin that are constantly warm and moist, such as the armpits or the groin. What are the causes? This condition is caused by a change in the normal balance of the yeast and bacteria that live on the skin. What increases the risk? This condition is more likely to develop in:  People who are obese.  Pregnant women.  Women who take birth control pills.  People who have diabetes.  People who take antibiotic medicines.  People who take steroid medicines.  People who are malnourished.  People who have a weak defense (immune) system.  People who are 65 years of age or older.  What are the signs or symptoms? Symptoms of this condition include:  A red, swollen area of the skin.  Bumps on the skin.  Itchiness.  How is this diagnosed? This condition is diagnosed with a medical history and physical exam. Your health care provider may check for yeast by taking light scrapings of the skin to be viewed under a microscope. How is this treated? This condition is treated with medicine. Medicines may be prescribed or be available over-the-counter. The medicines may be:  Taken by mouth (orally).  Applied as a cream.  Follow these instructions at home:  Take or apply over-the-counter and prescription medicines only as told by your health care provider.  Eat more yogurt. This may help to keep your yeast infection from returning.  Maintain a healthy weight. If you need help losing weight, talk with your health care provider.  Keep your skin clean and dry.  If you have diabetes, keep your blood sugar under control. Contact a health care provider if:  Your symptoms go away and then return.  Your symptoms do not get better with treatment.  Your symptoms get worse.  Your rash spreads.  You have a  fever or chills.  You have new symptoms.  You have new warmth or redness of your skin. This information is not intended to replace advice given to you by your health care provider. Make sure you discuss any questions you have with your health care provider. Document Released: 10/21/2010 Document Revised: 09/29/2015 Document Reviewed: 08/06/2014 Elsevier Interactive Patient Education  2018 Elsevier Inc.  

## 2016-11-23 NOTE — Progress Notes (Signed)
   Subjective:    Patient ID: Sara Clarke, female    DOB: 1991-02-03, 26 y.o.   MRN: [id number]  Rash  This is a new problem. The current episode started 1 to 4 weeks ago. The problem has been gradually worsening since onset. The affected locations include the abdomen. The rash is characterized by itchiness, redness and pain. She was exposed to nothing. Pertinent negatives include no congestion, cough, diarrhea, fever, joint pain, shortness of breath or sore throat. Past treatments include topical steroids. The treatment provided mild relief.      Review of Systems  Constitutional: Negative for fever.  HENT: Negative for congestion and sore throat.   Respiratory: Negative for cough and shortness of breath.   Gastrointestinal: Negative for diarrhea.  Musculoskeletal: Negative for joint pain.  Skin: Positive for rash.  All other systems reviewed and are negative.      Objective:   Physical Exam  Constitutional: She is oriented to person, place, and time. She appears well-developed and well-nourished. No distress.  HENT:  Head: Normocephalic.  Cardiovascular: Normal rate, regular rhythm, normal heart sounds and intact distal pulses.   No murmur heard. Pulmonary/Chest: Effort normal and breath sounds normal. No respiratory distress. She has no wheezes.  Abdominal: Soft. Bowel sounds are normal. She exhibits no distension. There is no tenderness.  Musculoskeletal: Normal range of motion. She exhibits no edema or tenderness.  Neurological: She is alert and oriented to person, place, and time.  Skin: Skin is warm and dry. Rash noted.  Erythemas rash under bilateral breast  Psychiatric: She has a normal mood and affect. Her behavior is normal. Judgment and thought content normal.  Vitals reviewed.     BP 125/83   Pulse 85   Temp 98.4 F (36.9 C) (Oral)   Ht  (1.549 m)   Wt 186 lb (84.4 kg)   BMI 35.14 kg/m      Assessment & Plan:  1. Candidiasis of skin Keep clean  and dry Do not scratch Start probiotic  RTO prn  - nystatin (MYCOSTATIN/NYSTOP) powder; Apply topically 4 (four) times daily.  Dispense: 15 g; Refill: 0 - nystatin-triamcinolone ointment (MYCOLOG); Apply 1 application topically 2 (two) times daily.  Dispense: 60 g; Refill: 0   Jannifer Rodney, FNP

## 2016-12-31 ENCOUNTER — Ambulatory Visit: Payer: BLUE CROSS/BLUE SHIELD | Admitting: Pediatrics

## 2016-12-31 ENCOUNTER — Encounter: Payer: Self-pay | Admitting: Pediatrics

## 2016-12-31 VITALS — BP 118/87 | HR 105 | Temp 98.3°F | Resp 18 | Ht 61.0 in | Wt 186.0 lb

## 2016-12-31 DIAGNOSIS — J029 Acute pharyngitis, unspecified: Secondary | ICD-10-CM

## 2016-12-31 DIAGNOSIS — R6889 Other general symptoms and signs: Secondary | ICD-10-CM | POA: Diagnosis not present

## 2016-12-31 DIAGNOSIS — J181 Lobar pneumonia, unspecified organism: Secondary | ICD-10-CM | POA: Diagnosis not present

## 2016-12-31 DIAGNOSIS — J189 Pneumonia, unspecified organism: Secondary | ICD-10-CM

## 2016-12-31 LAB — VERITOR FLU A/B WAIVED
INFLUENZA B: NEGATIVE
Influenza A: NEGATIVE

## 2016-12-31 LAB — CULTURE, GROUP A STREP

## 2016-12-31 LAB — RAPID STREP SCREEN (MED CTR MEBANE ONLY): STREP GP A AG, IA W/REFLEX: NEGATIVE

## 2016-12-31 MED ORDER — AZITHROMYCIN 250 MG PO TABS: ORAL_TABLET | ORAL | 0 refills | Status: DC

## 2016-12-31 NOTE — Progress Notes (Signed)
  Subjective:   Patient ID: Sara Clarke, female    DOB: 10/10/1990, 26 y.o.   MRN: [id number] CC: Hoarse and Sore Throat  HPI: Sara Clarke is a 26 y.o. female presenting for Hoarse and Sore Throat  Started getting sick 4 days ago Some sore throat Feeling cold with chills off and on Some coughing, mostly nonproductive Feeling short of breath more than the last couple days No fevers that she knows of Appetite has been down because of a sore throat No nausea or vomiting  Relevant past medical, surgical, family and social history reviewed. Allergies and medications reviewed and updated. Social History   Tobacco Use  Smoking Status Never Smoker  Smokeless Tobacco Never Used   ROS: Per HPI   Objective:    BP 118/87   Pulse (!) 105   Temp 98.3 F (36.8 C) (Oral)   Resp 18   Ht 5\' 1"  (1.549 m)   Wt 186 lb (84.4 kg)   SpO2 99%   BMI 35.14 kg/m   Wt Readings from Last 3 Encounters:  12/31/16 186 lb (84.4 kg)  11/23/16 186 lb (84.4 kg)  10/28/16 186 lb (84.4 kg)    Gen: NAD, alert, cooperative with exam, NCAT EYES: EOMI, no conjunctival injection, or no icterus ENT:  TMs pearly gray b/l, OP with mild erythema, enlarged tonsils LYMPH: small < 1cm b/l ant cervical LAD CV: NRRR, normal S1/S2, no murmur, distal pulses 2+ b/l Resp: moving air well, rhonchi RUL, no wheezes, normal WOB Abd: +BS, soft, NTND. no guarding or organomegaly Ext: No edema, warm EMS so as to try to about where patient Neuro: Alert and oriented, strength equal b/l UE and LE, coordination grossly normal MSK: normal muscle bulk  Assessment & Plan:  Sara Clarke was seen today for hoarse and sore throat.  Diagnoses and all orders for this visit:  Community acquired pneumonia of right upper lobe of lung (HCC) Treat based on clinical picture, physical exam Return precautions discussed -     azithromycin (ZITHROMAX) 250 MG tablet; Take 2 the first day and then one each day after.  Flu-like  symptoms Negative -     Veritor Flu A/B Waived  Sore throat Negative, will follow culture -     [facility]apid st[facility] screen ([facility]) -     Culture, Group A Strep   Follow up plan: Return if symptoms worsen or fail to improve. Rex Krasarol Vincent, MD [facility]

## 2017-01-02 LAB — CULTURE, GROUP A STREP: Strep A Culture: NEGATIVE

## 2017-01-04 ENCOUNTER — Telehealth: Payer: Self-pay | Admitting: Pediatrics

## 2017-01-04 ENCOUNTER — Other Ambulatory Visit: Payer: Self-pay | Admitting: Family Medicine

## 2017-01-04 NOTE — Telephone Encounter (Signed)
I would like to send her in an antibiotic, but since this is pneumonia the states are just too high.  She really should come in and be seen.  I recommend she come to the acute clinic today.

## 2017-01-04 NOTE — Telephone Encounter (Signed)
Patient was diagnosed with Pneumonia on 11/15 and symptoms have not improved. Patient was prescribed a Zpack and will take last pill today.   Patient is still having congestion and now rib pain.  She would like to know if a different antibiotic can be sent to her pharmacy.

## 2017-01-04 NOTE — Telephone Encounter (Signed)
Offered patient in the evening clinic.  Patient could not come at that time.  Appt made for tomorrow 11/20 at 2:10 [facility].

## 2017-01-05 ENCOUNTER — Ambulatory Visit (INDEPENDENT_AMBULATORY_CARE_PROVIDER_SITE_OTHER): Payer: BLUE CROSS/BLUE SHIELD

## 2017-01-05 ENCOUNTER — Ambulatory Visit: Payer: BLUE CROSS/BLUE SHIELD | Admitting: Family

## 2017-01-05 ENCOUNTER — Encounter: Payer: Self-pay | Admitting: Family

## 2017-01-05 VITALS — BP 122/86 | HR 84 | Temp 98.5°F | Ht 61.0 in | Wt 186.0 lb

## 2017-01-05 DIAGNOSIS — R05 Cough: Secondary | ICD-10-CM | POA: Diagnosis not present

## 2017-01-05 DIAGNOSIS — J189 Pneumonia, unspecified organism: Secondary | ICD-10-CM | POA: Diagnosis not present

## 2017-01-05 DIAGNOSIS — J069 Acute upper respiratory infection, unspecified: Secondary | ICD-10-CM | POA: Diagnosis not present

## 2017-01-05 DIAGNOSIS — R059 Cough, unspecified: Secondary | ICD-10-CM

## 2017-01-05 MED ORDER — FLUTICASONE PROPIONATE 50 MCG/ACT NA SUSP: 2.0000 | Freq: Every day | NASAL | 6 refills | Status: DC

## 2017-01-05 MED ORDER — PREDNISONE 10 MG (21) PO TBPK: ORAL_TABLET | ORAL | 0 refills | Status: DC

## 2017-01-05 NOTE — Patient Instructions (Signed)
Upper Respiratory Infection, Adult Most upper respiratory infections (URIs) are caused by a virus. A URI affects the nose, throat, and upper air passages. The most common type of URI is often called "the common cold." Follow these instructions at home:  Take medicines only as told by your doctor.  Gargle warm saltwater or take cough drops to comfort your throat as told by your doctor.  Use a warm mist humidifier or inhale steam from a shower to increase air moisture. This may make it easier to breathe.  Drink enough fluid to keep your pee (urine) clear or pale yellow.  Eat soups and other clear broths.  Have a healthy diet.  Rest as needed.  Go back to work when your fever is gone or your doctor says it is okay. ? You may need to stay home longer to avoid giving your URI to others. ? You can also wear a face mask and wash your hands often to prevent spread of the virus.  Use your inhaler more if you have asthma.  Do not use any tobacco products, including cigarettes, chewing tobacco, or electronic cigarettes. If you need help quitting, ask your doctor. Contact a doctor if:  You are getting worse, not better.  Your symptoms are not helped by medicine.  You have chills.  You are getting more short of breath.  You have brown or red mucus.  You have yellow or brown discharge from your nose.  You have pain in your face, especially when you bend forward.  You have a fever.  You have puffy (swollen) neck glands.  You have pain while swallowing.  You have white areas in the back of your throat. Get help right away if:  You have very bad or constant: ? Headache. ? Ear pain. ? Pain in your forehead, behind your eyes, and over your cheekbones (sinus pain). ? Chest pain.  You have long-lasting (chronic) lung disease and any of the following: ? Wheezing. ? Long-lasting cough. ? Coughing up blood. ? A change in your usual mucus.  You have a stiff neck.  You have  changes in your: ? Vision. ? Hearing. ? Thinking. ? Mood. This information is not intended to replace advice given to you by your health care provider. Make sure you discuss any questions you have with your health care provider. Document Released: 07/22/2007 Document Revised: 10/06/2015 Document Reviewed: 05/10/2013 Elsevier Interactive Patient Education  2018 Elsevier Inc.  

## 2017-01-05 NOTE — Progress Notes (Signed)
Subjective:    Patient ID: Sara Clarke, female    DOB: 06/27/1990, 26 y.o.   MRN: [id number]  Pt presents to the office today with recurrent cough, sore throat, and back pain. PT was seen on 12/31/16 and diagnosed with CAP and given Zpak. PT states she continues to not feel good.  Cough  This is a recurrent problem. The problem has been unchanged. The problem occurs every few minutes. The cough is non-productive. Associated symptoms include chills, ear pain, myalgias, rhinorrhea, a sore throat, shortness of breath and wheezing. Pertinent negatives include no ear congestion, fever, headaches or nasal congestion. The symptoms are aggravated by lying down. She has tried rest and OTC cough suppressant (zpak) for the symptoms. The treatment provided mild relief. There is no history of asthma.  Sore Throat   Associated symptoms include coughing, ear pain and shortness of breath. Pertinent negatives include no headaches.      Review of Systems  Constitutional: Positive for chills. Negative for fever.  HENT: Positive for ear pain, rhinorrhea and sore throat.   Respiratory: Positive for cough, shortness of breath and wheezing.   Musculoskeletal: Positive for myalgias.  Neurological: Negative for headaches.  All other systems reviewed and are negative.      Objective:   Physical Exam  Constitutional: She is oriented to person, place, and time. She appears well-developed and well-nourished. No distress.  HENT:  Head: Normocephalic and atraumatic.  Right Ear: External ear normal.  Left Ear: External ear normal.  Nose: Mucosal edema and rhinorrhea present.  Mouth/Throat: Posterior oropharyngeal erythema present.  Eyes: Pupils are equal, round, and reactive to light.  Neck: Normal range of motion. Neck supple. No thyromegaly present.  Cardiovascular: Normal rate, regular rhythm, normal heart sounds and intact distal pulses.  No murmur heard. Pulmonary/Chest: Effort normal and breath sounds  normal. No respiratory distress. She has no wheezes.  Abdominal: Soft. Bowel sounds are normal. She exhibits no distension. There is no tenderness.  Musculoskeletal: Normal range of motion. She exhibits no edema or tenderness.  Neurological: She is alert and oriented to person, place, and time. She has normal reflexes. No cranial nerve deficit.  Skin: Skin is warm and dry.  Psychiatric: She has a normal mood and affect. Her behavior is normal. Judgment and thought content normal.  Vitals reviewed.   Chest x-ray- negative Preliminary reading by Jannifer Rodneyhristy Hawks, FNP [facility]   BP 122/86   Pulse 84   Temp 98.5 F (36.9 C) (Oral)   Ht 5\' 1"  (1.549 m)   Wt 186 lb (84.4 kg)   SpO2 100%   BMI 35.14 kg/m      Assessment & Plan:  1. Cough - DG Chest 2 View; Future - fluticasone (FLONASE) 50 MCG/ACT nasal spray; Place 2 sprays into both nostrils daily.  Dispense: 16 g; Refill: 6 - predniSONE (STERAPRED UNI-PAK 21 TAB) 10 MG (21) TBPK tablet; Use as directed  Dispense: 21 tablet; Refill: 0  2. Viral upper respiratory illness - Take meds as prescribed - Use a cool mist humidifier  -Use saline nose sprays frequently -Saline irrigations of the nose can be very helpful if done frequently. -Force fluids -For any cough or congestion  Use plain Mucinex- regular strength or max strength is fine -For fever or aces or pains- take tylenol or ibuprofen appropriate for age and weight. -Throat lozenges if help - fluticasone (FLONASE) 50 MCG/ACT nasal spray; Place 2 sprays into both nostrils daily.  Dispense: 16 g; Refill: 6 -  predniSONE (STERAPRED UNI-PAK 21 TAB) 10 MG (21) TBPK tablet; Use as directed  Dispense: 21 tablet; Refill: 0  3. Community acquired pneumonia, unspecified laterality Resolved - predniSONE (STERAPRED UNI-PAK 21 TAB) 10 MG (21) TBPK tablet; Use as directed  Dispense: 21 tablet; Refill: 0    Jannifer Rodneyhristy Hawks, FNP

## 2017-04-27 ENCOUNTER — Encounter: Payer: Self-pay | Admitting: Pediatrics

## 2017-04-27 ENCOUNTER — Ambulatory Visit (INDEPENDENT_AMBULATORY_CARE_PROVIDER_SITE_OTHER): Payer: 59 | Admitting: Pediatrics

## 2017-04-27 VITALS — BP 134/81 | HR 117 | Temp 100.0°F | Ht 61.0 in | Wt 189.6 lb

## 2017-04-27 DIAGNOSIS — J029 Acute pharyngitis, unspecified: Secondary | ICD-10-CM | POA: Diagnosis not present

## 2017-04-27 DIAGNOSIS — R6889 Other general symptoms and signs: Secondary | ICD-10-CM

## 2017-04-27 DIAGNOSIS — R112 Nausea with vomiting, unspecified: Secondary | ICD-10-CM

## 2017-04-27 DIAGNOSIS — R197 Diarrhea, unspecified: Secondary | ICD-10-CM

## 2017-04-27 DIAGNOSIS — B349 Viral infection, unspecified: Secondary | ICD-10-CM

## 2017-04-27 LAB — VERITOR FLU A/B WAIVED
INFLUENZA A: NEGATIVE
INFLUENZA B: NEGATIVE

## 2017-04-27 LAB — CULTURE, GROUP A STREP

## 2017-04-27 LAB — RAPID STREP SCREEN (MED CTR MEBANE ONLY): STREP GP A AG, IA W/REFLEX: NEGATIVE

## 2017-04-27 MED ORDER — ONDANSETRON 4 MG PO TBDP: 4.0000 mg | ORAL_TABLET | Freq: Two times a day (BID) | ORAL | 0 refills | Status: DC | PRN

## 2017-04-27 NOTE — Progress Notes (Signed)
  Subjective:   Patient ID: Rosemarie AxHeather L Pretty, female    DOB: 08/17/1990, 27 y.o.   MRN: [id number] CC: Abdominal Pain; Emesis; Diarrhea; Sore Throat; Nasal Congestion; Headache; and Fever  HPI: Rosemarie AxHeather L Wehrman is a 27 y.o. female presenting for Abdominal Pain; Emesis; Diarrhea; Sore Throat; Nasal Congestion; Headache; and Fever  Yesterday morning about 1 AM woke up with some nausea, vomited once then and 2 more times later on in the day.  Also had about 3 episodes of loose stools.  Has had congestion, sore throat, aches, chills, subjective fevers at home.  Appetite has been down.  Family member sick with strep recently.  Has been also with upper respiratory infection symptoms.  Relevant past medical, surgical, family and social history reviewed. Allergies and medications reviewed and updated. Social History   Tobacco Use  Smoking Status Never Smoker  Smokeless Tobacco Never Used   ROS: Per HPI   Objective:    BP 134/81   Pulse (!) 117   Temp 100 F (37.8 C) (Oral)   Ht 5\' 1"  (1.549 m)   Wt 189 lb 9.6 oz (86 kg)   BMI 35.82 kg/m   Wt Readings from Last 3 Encounters:  04/27/17 189 lb 9.6 oz (86 kg)  01/05/17 186 lb (84.4 kg)  12/31/16 186 lb (84.4 kg)    Gen: alert EYES: EOMI, no conjunctival injection, or no icterus ENT:  TMs pearly gray b/l, OP without erythema, grade 3 tonsils without exudate or petechia LYMPH: no cervical LAD CV: NRRR, normal S1/S2, no murmur, distal pulses 2+ b/l Resp: CTABL, no wheezes, normal WOB Abd: +BS, soft, mildly tender with deep palpation, ND. no guarding or organomegaly Ext: No edema, warm Neuro: Alert and oriented, strength equal b/l UE and LE, coordination grossly normal MSK: normal muscle bulk  Assessment & Plan:  [name] was seen today for abdominal pain, emesis, diarrhea, sore throat, nasal congestion, headache and fever.  Diagnoses and all orders for this visit:  Sore throat Rapid flu and strep test both negative. -     Veritor Flu  A/B Waived -     Rapid [facility] Screen ([facility])  Flu-like symptoms -     Veritor Flu A/B Waived  Nausea and vomiting, intractability of vomiting not specified, unspecified vomiting type Started yesterday, last vomiting was yesterday.  Still feels nauseous today.  Able to keep fluids down.  Use below as needed #6 tabs given. -     ondansetron (ZOFRAN-ODT) 4 MG disintegrating tablet; Take 1 tablet (4 mg total) by mouth every 12 (twelve) hours as needed for nausea or vomiting.  Diarrhea, unspecified type  Viral syndrome Symptomatic care, return precautions discussed.  Follow up plan: As needed Rex Krasarol Vincent, MD [facility]

## 2017-06-14 DIAGNOSIS — Z124 Encounter for screening for malignant neoplasm of cervix: Secondary | ICD-10-CM | POA: Diagnosis not present

## 2017-06-14 DIAGNOSIS — Z01419 Encounter for gynecological examination (general) (routine) without abnormal findings: Secondary | ICD-10-CM | POA: Diagnosis not present

## 2017-06-14 DIAGNOSIS — Z130 Encounter for screening for diseases of the blood and blood-forming organs and certain disorders involving the immune mechanism: Secondary | ICD-10-CM | POA: Diagnosis not present

## 2017-06-14 DIAGNOSIS — Z1329 Encounter for screening for other suspected endocrine disorder: Secondary | ICD-10-CM | POA: Diagnosis not present

## 2017-07-16 DIAGNOSIS — E039 Hypothyroidism, unspecified: Secondary | ICD-10-CM | POA: Diagnosis not present

## 2017-09-14 ENCOUNTER — Ambulatory Visit (INDEPENDENT_AMBULATORY_CARE_PROVIDER_SITE_OTHER): Payer: 59 | Admitting: Nurse Practitioner

## 2017-09-14 ENCOUNTER — Encounter: Payer: Self-pay | Admitting: Nurse Practitioner

## 2017-09-14 VITALS — BP 112/72 | HR 88 | Temp 98.3°F | Ht 61.0 in | Wt 187.4 lb

## 2017-09-14 DIAGNOSIS — R0781 Pleurodynia: Secondary | ICD-10-CM | POA: Diagnosis not present

## 2017-09-14 DIAGNOSIS — L209 Atopic dermatitis, unspecified: Secondary | ICD-10-CM

## 2017-09-14 MED ORDER — CLOTRIMAZOLE-BETAMETHASONE 1-0.05 % EX CREA
1.0000 | TOPICAL_CREAM | Freq: Two times a day (BID) | CUTANEOUS | 0 refills | Status: DC
Start: 2017-09-14 — End: 2021-07-25

## 2017-09-14 MED ORDER — NAPROXEN 500 MG PO TABS: 500.0000 mg | ORAL_TABLET | Freq: Two times a day (BID) | ORAL | 1 refills | Status: DC

## 2017-09-14 NOTE — Progress Notes (Signed)
   Subjective:    Patient ID: Sara Clarke, female    DOB: 07/01/1990, 27 y.o.   MRN: [id number]   Chief Complaint: Back Pain (pt here today c/o back pain after a fall over the weekend)   HPI -Patient comes in today c/o of falling on Sunday. Said she tripped and fell and hit her right side on floor. So sore that hurts to take a dep breath. Stabbing pain on right flank. - has rash on palms of hands that is itching. Does ot wear gloves, has not changed dish detergents. Seem to be getting worse.  Review of Systems  Constitutional: Negative.  Negative for activity change and appetite change.  HENT: Negative.   Eyes: Negative for pain.  Respiratory: Negative for shortness of breath.   Cardiovascular: Negative for chest pain, palpitations and leg swelling.  Gastrointestinal: Negative for abdominal pain.  Endocrine: Negative for polydipsia.  Genitourinary: Negative.   Skin: Positive for rash (palms of hands).  Neurological: Negative for dizziness, weakness and headaches.  Hematological: Does not bruise/bleed easily.  Psychiatric/Behavioral: Negative.   All other systems reviewed and are negative.      Objective:   Physical Exam  Constitutional: She is oriented to person, place, and time. She appears well-developed and well-nourished.  Cardiovascular: Normal rate and regular rhythm.  Pulmonary/Chest: Effort normal and breath sounds normal.  Tenderness to palpation on right lateral rib cage.- no contusions  Neurological: She is alert and oriented to person, place, and time.  Skin: Skin is warm.  Erythematous raised scaley rash on palms of both hands.  Psychiatric: She has a normal mood and affect. Her behavior is normal. Thought content normal.      BP 112/72   Pulse 88   Temp 98.3 F (36.8 C) (Oral)   Ht 5\' 1"  (1.549 m)   Wt 187 lb 6 oz (85 kg)   BMI 35.40 kg/m        Assessment & Plan:  Sara Clarke in today with chief complaint of Back Pain (pt here today c/o back  pain after a fall over the weekend)   1. Rib pain on right side Moist heat to area Cough and deep breath every 2 hours while splinting area with pillow - naproxen (NAPROSYN) 500 MG tablet; Take 1 tablet (500 mg total) by mouth 2 (two) times daily with a meal.  Dispense: 60 tablet; Refill: 1  2. Atopic dermatitis, unspecified type Keep hands clean and dry Try to avoid scratching rto prn  Mary-Margaret Daphine DeutscherMartin, FNP  - clotrimazole-betamethasone (LOTRISONE) cream; Apply 1 application topically 2 (two) times daily.  Dispense: 45 g; Refill: 0

## 2017-09-14 NOTE — Patient Instructions (Signed)

## 2017-09-20 ENCOUNTER — Telehealth: Payer: Self-pay | Admitting: Family

## 2017-09-20 ENCOUNTER — Encounter: Payer: Self-pay | Admitting: *Deleted

## 2017-09-20 NOTE — Telephone Encounter (Signed)
Pt aware - letter written and she is aware will be up front

## 2017-09-20 NOTE — Telephone Encounter (Signed)
Ok for note to be out of work

## 2017-09-24 DIAGNOSIS — Z029 Encounter for administrative examinations, unspecified: Secondary | ICD-10-CM

## 2017-09-28 ENCOUNTER — Ambulatory Visit: Payer: 59 | Admitting: Nurse Practitioner

## 2017-09-28 ENCOUNTER — Ambulatory Visit (INDEPENDENT_AMBULATORY_CARE_PROVIDER_SITE_OTHER): Payer: 59

## 2017-09-28 ENCOUNTER — Telehealth: Payer: Self-pay | Admitting: Nurse Practitioner

## 2017-09-28 ENCOUNTER — Encounter: Payer: Self-pay | Admitting: Nurse Practitioner

## 2017-09-28 VITALS — BP 115/81 | HR 83 | Temp 97.8°F | Ht 61.0 in | Wt 188.0 lb

## 2017-09-28 DIAGNOSIS — S299XXA Unspecified injury of thorax, initial encounter: Secondary | ICD-10-CM | POA: Diagnosis not present

## 2017-09-28 DIAGNOSIS — R109 Unspecified abdominal pain: Secondary | ICD-10-CM

## 2017-09-28 NOTE — Progress Notes (Signed)
   Subjective:    Patient ID: Sara Clarke, female    DOB: 12/22/1990, 27 y.o.   MRN: [id number]   Chief Complaint: Chest Pain (Seen two weeks ago and picked up child and heard pop) and Left ear   HPI Patient come sin today c/o continued right flank pain. She had fell 2 weeks ago Sunday and landed on her side.  She was seen in office on 08/3017 and was given naprosyn. She has not gone back to work due to pain. Rates pain 7/10 today. Pain increases with movement or lifting. Laying down on heating pain helps pain. She lifted her daughter yesterday and felt a popping sensation. Pain has increased since then.    Review of Systems  Constitutional: Negative.   Respiratory: Negative.   Cardiovascular: Negative.        Right flank oian when taking deeo breathe  Neurological: Negative.   Psychiatric/Behavioral: Negative.   All other systems reviewed and are negative.      Objective:   Physical Exam  Constitutional: She is oriented to person, place, and time. She appears well-developed and well-nourished. She appears distressed (mild).  Pulmonary/Chest: Effort normal and breath sounds normal. She has no decreased breath sounds.  Neurological: She is alert and oriented to person, place, and time.  Skin: Skin is warm and dry.  Psychiatric: She has a normal mood and affect. Her behavior is normal.  Nursing note and vitals reviewed.  BP 115/81   Pulse 83   Temp 97.8 F (36.6 C) (Oral)   Ht 5\' 1"  (1.549 m)   Wt 188 lb (85.3 kg)   BMI 35.52 kg/m   Chest xray- no rib fractures noted-Preliminary reading by Paulene FloorMary Indiana Pechacek, FNP  [facility]      Assessment & Plan:  Sara Clarke in today with chief complaint of Chest Pain (Seen two weeks ago and picked up child and heard pop) and Left ear   1. Right flank pain Continue naprosyn as rx Moist heat Out of work till [employer] - DG Chest 2 View; Future  Mary-Margaret Daphine DeutscherMartin, FNP

## 2017-10-06 ENCOUNTER — Encounter: Payer: Self-pay | Admitting: Pediatrics

## 2017-10-06 ENCOUNTER — Ambulatory Visit: Payer: 59 | Admitting: Pediatrics

## 2017-10-06 VITALS — BP 118/80 | HR 83 | Temp 99.1°F | Ht 61.0 in | Wt 187.4 lb

## 2017-10-06 DIAGNOSIS — R0781 Pleurodynia: Secondary | ICD-10-CM | POA: Diagnosis not present

## 2017-10-06 NOTE — Patient Instructions (Signed)
 440mg  (2 tabs) naproxen twice a day  OR  600mg  ibuprofen 3 times a day

## 2017-10-06 NOTE — Progress Notes (Signed)
  Subjective:   Patient ID: Sara Clarke, female    DOB: 03/04/1990, 27 y.o.   MRN: [id number] CC: Back Pain (Right mid)  HPI: Sara Clarke is a 27 y.o. female  Larey SeatFell on her right side about 3 weeks ago.  Was seen in office 1 week ago, had normal chest x-ray.  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  Now the pain is just as bad as it was at the beginning.  She is taking naproxen or ibuprofen regularly.  Relevant past medical, surgical, family and social history reviewed. Allergies and medications reviewed and updated. Social History   Tobacco Use  Smoking Status Never Smoker  Smokeless Tobacco Never Used   ROS: Per HPI   Objective:    BP 118/80   Pulse 83   Temp 99.1 F (37.3 C) (Oral)   Ht 5\' 1"  (1.549 m)   Wt 187 lb 6.4 oz (85 kg)   BMI 35.41 kg/m   Wt Readings from Last 3 Encounters:  10/06/17 187 lb 6.4 oz (85 kg)  09/28/17 188 lb (85.3 kg)  09/14/17 187 lb 6 oz (85 kg)    Gen: NAD, alert, cooperative with exam, NCAT EYES: EOMI, no conjunctival injection, or no icterus CV: NRRR, normal S1/S2, no murmur, distal pulses 2+ b/l Resp: CTABL, no wheezes, normal WOB Ext: No edema, warm Neuro: Alert and oriented MSK: ttp mid ribs right side on back and axillary line.. Skin: No bruising  Assessment & Plan:  Sara Clarke was seen today for back pain.  Diagnoses and all orders for this visit:  Rib pain on right side Worsened when she went back to work.  Continue NSAIDs, rest, ice as needed.  Has been affecting her ability to work as she needs to lift heavy things.  She has for letter for work.  Follow up plan: prn Rex Krasarol Vincent, MD [facility]

## 2017-10-13 ENCOUNTER — Ambulatory Visit: Payer: 59 | Admitting: Pediatrics

## 2017-10-13 ENCOUNTER — Encounter: Payer: Self-pay | Admitting: Pediatrics

## 2017-10-13 VITALS — BP 116/78 | HR 77 | Temp 98.2°F | Ht 61.0 in | Wt 187.4 lb

## 2017-10-13 DIAGNOSIS — G8929 Other chronic pain: Secondary | ICD-10-CM | POA: Diagnosis not present

## 2017-10-13 DIAGNOSIS — M546 Pain in thoracic spine: Secondary | ICD-10-CM

## 2017-10-13 MED ORDER — CYCLOBENZAPRINE HCL 5 MG PO TABS: 5.0000 mg | ORAL_TABLET | Freq: Three times a day (TID) | ORAL | 1 refills | Status: DC | PRN

## 2017-10-13 NOTE — Progress Notes (Signed)
  Subjective:   Patient ID: Sara Clarke, female    DOB: 05/05/1990, 27 y.o.   MRN: [id number] CC: Back Pain (1 week, recheck)  HPI: Sara Clarke is a 27 y.o. female   Ongoing pain in her back.  Was seen 1 week ago for similar.  Pain is been going on for the last 4 weeks since she fell on her right side.  For work she has to lift 20 to 50 pounds at a time.  She is not able to do that without worsening her back pain right now.  No shortness of breath, otherwise has been feeling well.  No fevers  Relevant past medical, surgical, family and social history reviewed. Allergies and medications reviewed and updated. Social History   Tobacco Use  Smoking Status Never Smoker  Smokeless Tobacco Never Used   ROS: Per HPI   Objective:    BP 116/78   Pulse 77   Temp 98.2 F (36.8 C) (Oral)   Ht 5\' 1"  (1.549 m)   Wt 187 lb 6.4 oz (85 kg)   BMI 35.41 kg/m   Wt Readings from Last 3 Encounters:  10/13/17 187 lb 6.4 oz (85 kg)  10/06/17 187 lb 6.4 oz (85 kg)  09/28/17 188 lb (85.3 kg)    Gen: NAD, alert, cooperative with exam, NCAT EYES: EOMI, no conjunctival injection, or no icterus CV: NRRR, normal S1/S2, no murmur, distal pulses 2+ b/l Resp: CTABL, no wheezes, normal WOB Ext: No edema, warm Neuro: Alert and oriented, strength equal b/l UE and LE, coordination grossly normal MSK: no point tenderness over spine.  Tender to palpation right paraspinal muscles mid back.  Negative straight leg raise.  Some discomfort right mid back with twisting to the right and spinal flexion.  No pain with extension or twisting to the left.  Assessment & Plan:  Sara Clarke was seen today for back pain.  Diagnoses and all orders for this visit:  Chronic right-sided thoracic back pain Recommended not lifting more than 5 to 10 pounds of weight at a time.  Patient says she would not be able to work with those restrictions.  Note given for work for the next 3 weeks.  Referral to physical therapy, pain is been  ongoing for at least 4 weeks.  Also do trial of muscle relaxer per below.  May cause drowsiness, do not take and drive.  Keep away from small children.  Continue Tylenol and ibuprofen as needed.  Gentle back exercises given. -     cyclobenzaprine (FLEXERIL) 5 MG tablet; Take 1 tablet (5 mg total) by mouth 3 (three) times daily as needed for muscle spasms. -     Ambulatory referral to Physical Therapy   Follow up plan: Return in about 3 weeks (around 11/03/2017). Rex Krasarol Vincent, MD [facility]

## 2017-10-13 NOTE — Patient Instructions (Signed)
Back Exercises If you have pain in your back, do these exercises 2-3 times each day or as told by your doctor. When the pain goes away, do the exercises once each day, but repeat the steps more times for each exercise (do more repetitions). If you do not have pain in your back, do these exercises once each day or as told by your doctor. Exercises Single Knee to Chest  Do these steps 3-5 times in a row for each leg: 1. Lie on your back on a firm bed or the floor with your legs stretched out. 2. Bring one knee to your chest. 3. Hold your knee to your chest by grabbing your knee or thigh. 4. Pull on your knee until you feel a gentle stretch in your lower back. 5. Keep doing the stretch for 10-30 seconds. 6. Slowly let go of your leg and straighten it.  Pelvic Tilt  Do these steps 5-10 times in a row: 1. Lie on your back on a firm bed or the floor with your legs stretched out. 2. Bend your knees so they point up to the ceiling. Your feet should be flat on the floor. 3. Tighten your lower belly (abdomen) muscles to press your lower back against the floor. This will make your tailbone point up to the ceiling instead of pointing down to your feet or the floor. 4. Stay in this position for 5-10 seconds while you gently tighten your muscles and breathe evenly.  Cat-Cow  Do these steps until your lower back bends more easily: 1. Get on your hands and knees on a firm surface. Keep your hands under your shoulders, and keep your knees under your hips. You may put padding under your knees. 2. Let your head hang down, and make your tailbone point down to the floor so your lower back is round like the back of a cat. 3. Stay in this position for 5 seconds. 4. Slowly lift your head and make your tailbone point up to the ceiling so your back hangs low (sags) like the back of a cow. 5. Stay in this position for 5 seconds.  Press-Ups  Do these steps 5-10 times in a row: 1. Lie on your belly (face-down)  on the floor. 2. Place your hands near your head, about shoulder-width apart. 3. While you keep your back relaxed and keep your hips on the floor, slowly straighten your arms to raise the top half of your body and lift your shoulders. Do not use your back muscles. To make yourself more comfortable, you may change where you place your hands. 4. Stay in this position for 5 seconds. 5. Slowly return to lying flat on the floor.  Bridges  Do these steps 10 times in a row: 1. Lie on your back on a firm surface. 2. Bend your knees so they point up to the ceiling. Your feet should be flat on the floor. 3. Tighten your butt muscles and lift your butt off of the floor until your waist is almost as high as your knees. If you do not feel the muscles working in your butt and the back of your thighs, slide your feet 1-2 inches farther away from your butt. 4. Stay in this position for 3-5 seconds. 5. Slowly lower your butt to the floor, and let your butt muscles relax.  If this exercise is too easy, try doing it with your arms crossed over your chest. Back Lifts Do these steps 5-10 times in a   row: 1. Lie on your belly (face-down) with your arms at your sides, and rest your forehead on the floor. 2. Tighten the muscles in your legs and your butt. 3. Slowly lift your chest off of the floor while you keep your hips on the floor. Keep the back of your head in line with the curve in your back. Look at the floor while you do this. 4. Stay in this position for 3-5 seconds. 5. Slowly lower your chest and your face to the floor.  Contact a doctor if:  Your back pain gets a lot worse when you do an exercise.  Your back pain does not lessen 2 hours after you exercise. If you have any of these problems, stop doing the exercises. Do not do them again unless your doctor says it is okay. Get help right away if:  You have sudden, very bad back pain. If this happens, stop doing the exercises. Do not do them again  unless your doctor says it is okay. This information is not intended to replace advice given to you by your health care provider. Make sure you discuss any questions you have with your health care provider. Document Released: 03/07/2010 Document Revised: 07/11/2015 Document Reviewed: 03/29/2014 Elsevier Interactive Patient Education  2018 Elsevier Inc.   

## 2017-10-25 ENCOUNTER — Telehealth: Payer: Self-pay | Admitting: Family

## 2017-10-25 ENCOUNTER — Encounter: Payer: Self-pay | Admitting: Physical Therapy

## 2017-10-25 ENCOUNTER — Ambulatory Visit: Payer: 59 | Attending: Pediatrics | Admitting: Physical Therapy

## 2017-10-25 ENCOUNTER — Other Ambulatory Visit: Payer: Self-pay

## 2017-10-25 DIAGNOSIS — M546 Pain in thoracic spine: Secondary | ICD-10-CM | POA: Diagnosis not present

## 2017-10-25 DIAGNOSIS — Z029 Encounter for administrative examinations, unspecified: Secondary | ICD-10-CM

## 2017-10-25 NOTE — Therapy (Addendum)
[facility] [address] [place], [place], [address] Phone: [phone]   Fax:  [phone]  Physical Therapy Evaluation  Patient Details  Name: Sara Clarke MRN: [id number] Date of Birth: 01/19/1991 Referring Provider: Rex Kras MD.   Encounter Date: 10/25/2017  PT End of Session - 10/25/17 1249    Visit Number  1    Number of Visits  12    Date for PT Re-Evaluation  12/06/17    PT Start Time  0903    PT Stop Time  0950    PT Time Calculation (min)  47 min       Past Medical History:  Diagnosis Date  . Asthma   . Hypothyroidism   . PONV (postoperative nausea and vomiting)    nausea/vomiting with previous c section.    Past Surgical History:  Procedure Laterality Date  . CESAREAN SECTION    . CESAREAN SECTION N/A 12/11/2015   Procedure: CESAREAN SECTION;  Surgeon: Waynard Reeds, MD;  Location: [facility];  Service: Obstetrics;  Laterality: N/A;    There were no vitals filed for this visit.   Subjective Assessment - 10/25/17 1259    Subjective  The patient presents to PT with c/o righ sided rib pain.  She states it occured In July of this year when she was trying to catch her child and fell onto her right side.  Since then she has been in pain.  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  Her pain level today is a 6/10 but can rise to 7-8/10when picking stuff up, moving a lot and sometimes deep breathing.  Lying down and rest decrease her pain.    Pertinent History  Previous right ankle injury.    Diagnostic tests  (-) X-ray.    Currently in Pain?  Yes    Pain Score  6     Pain Location  Rib cage    Pain Orientation  Right    Pain Descriptors / Indicators  Sharp;Shooting    Pain Type  Acute pain    Pain Onset  More than a month ago    Pain Frequency  Constant    Aggravating Factors   See above.    Pain Relieving Factors  See above.                     Objective measurements completed on examination: See above findings.              PT Education - 10/25/17 1401    Education Details  Scapular retraction with red theraband.  Elbows at shoulder height and by side.  Band provided to patient.      Person(s) Educated  Patient    Methods  Explanation;Demonstration    Comprehension  Verbalized understanding;Returned demonstration          PT Long Term Goals - 10/25/17 1349      PT LONG TERM GOAL #1   Title  Independent with a HEP.    Time  6    Period  Weeks    Status  New      PT LONG TERM GOAL #2   Title  Perform ADL's with pain not > 2/10.    Time  6    Period  Weeks    Status  New      PT LONG TERM GOAL #3   Title  Return to work.  Time  6    Period  Weeks    Status  New             Plan - 10/25/17 1336    Clinical Impression Statement  The patient presents to OPPT with c/o mid-thoracic pain and pain into right lateral ribcage region.  She is unable to return to work at [employer] at this time due to the lifting requirements of her job.  Patient will benefit from skilled physical therapy intervention to address deficits.    Clinical Presentation  Stable    Clinical Presentation due to:  Not worsening.    Clinical Decision Making  Low    Rehab Potential  Excellent    PT Frequency  2x / week    PT Duration  6 weeks    PT Treatment/Interventions  ADLs/Self Care Home Management;Cryotherapy;Electrical Stimulation;Ultrasound;Moist Heat;Therapeutic activities;Therapeutic exercise;Patient/family education;Manual techniques    PT Next Visit Plan  Gentle mid-thoracic PA mobs and costo-vertebral mobs per PT; STW/M to mid-thoracic right rib region; scapular strengthening.  HMP and electrical stimulation.    Consulted and Agree with Plan of Care  Patient       Patient will benefit from skilled therapeutic intervention in order to improve the following deficits and impairments:  Pain,  Decreased activity tolerance  Visit Diagnosis: Pain in thoracic spine - Plan: PT plan of care cert/re-cert     Problem List Patient Active Problem List   Diagnosis Date Noted  . Cesarean delivery, delivered, current hospitalization 12/11/2015  . Right ankle pain 07/17/2014  . Unspecified hypothyroidism 04/17/2012  . 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

## 2017-10-28 ENCOUNTER — Encounter: Payer: Self-pay | Admitting: Physical Therapy

## 2017-10-28 ENCOUNTER — Ambulatory Visit: Payer: 59 | Admitting: Physical Therapy

## 2017-10-28 DIAGNOSIS — M546 Pain in thoracic spine: Secondary | ICD-10-CM

## 2017-10-28 NOTE — Telephone Encounter (Signed)
Pt called wanting f/u appt with Dr Oswaldo DoneVincent so appt scheduled for 11/08/17 at 11:30. Pt states she has started Physical Therapy but her work note only went through to 11/04/17. She would like another note to extend til her appt. Ok for note? Pt aware you are out of office until next week.

## 2017-10-28 NOTE — Therapy (Signed)
[facility] [address] [place], [place], [address] Phone: [phone]   Fax:  [phone]  Physical Therapy Treatment  Patient Details  Name: JOSEY DETTMANN MRN: [id number] Date of Birth: [birth year] Referring Provider: Rex Kras MD.   Encounter Date: 10/28/2017  PT End of Session - 10/28/17 1022    Visit Number  2    Number of Visits  12    Date for PT Re-Evaluation  12/06/17    PT Start Time  0947    PT Stop Time  1031    PT Time Calculation (min)  44 min    Activity Tolerance  Patient tolerated treatment well;Patient limited by pain    Behavior During Therapy  [facility] for tasks assessed/performed       Past Medical History:  Diagnosis Date  . Asthma   . Hypothyroidism   . PONV (postoperative nausea and vomiting)    nausea/vomiting with previous c section.    Past Surgical History:  Procedure Laterality Date  . CESAREAN SECTION    . CESAREAN SECTION N/A 12/11/2015   Procedure: CESAREAN SECTION;  Surgeon: Waynard Reeds, MD;  Location: [facility] BIRTHING SUITES;  Service: Obstetrics;  Laterality: N/A;    There were no vitals filed for this visit.  Subjective Assessment - 10/28/17 0955    Subjective  Patient arrived with incresed soreness tody for unknown reason    Pertinent History  Previous right ankle injury.    Diagnostic tests  (-) X-ray.    Currently in Pain?  Yes    Pain Score  7     Pain Location  Rib cage    Pain Orientation  Right    Pain Descriptors / Indicators  Sharp;Shooting    Pain Onset  More than a month ago    Pain Frequency  Constant    Aggravating Factors   increased activity or lifting    Pain Relieving Factors  at rest and hest                       [facility] Adult PT Treatment/Exercise - 10/28/17 0001      Self-Care   Self-Care  ADL's;Lifting;Posture;Other Self-Care Comments    ADL's  work and home posture activities    Lifting  power lift and technique with child and work requirements     Posture  all positions      Exercises   Exercises  Shoulder      Shoulder Exercises: Seated   Other Seated Exercises  seated W, T, V no resistance 2x10      Moist Heat Therapy   Number Minutes Moist Heat  15 Minutes    Moist Heat Location  Other (comment)   mid back     Electrical Stimulation   Electrical Stimulation Location  Right mid-thoracic region.    Electrical Stimulation Action  IFC    Electrical Stimulation Parameters  80-150hz  x17min    Electrical Stimulation Goals  Pain      Manual Therapy   Manual Therapy  Soft tissue mobilization;Myofascial release    Manual therapy comments  gentle STW/MFR to mid back paraspinals and along ribs area of pain to reduce pain and tone                  PT Long Term Goals - 10/28/17 1024      PT LONG TERM GOAL #1   Title  Independent with a HEP.    Time  6  Period  Weeks    Status  On-going      PT LONG TERM GOAL #2   Title  Perform ADL's with pain not > 2/10.    Time  6    Period  Weeks    Status  On-going      PT LONG TERM GOAL #3   Title  Return to work.    Time  6    Period  Weeks    Status  On-going      PT LONG TERM GOAL #4   Title  Walk a normal daily distance for patient with right ankle pain not > 2/10.    Time  6    Period  Weeks    Status  On-going            Plan - 10/28/17 1024    Clinical Impression Statement  Patient tolerated treatment fair today, due to increased pain upon arrival. Today focused on gentle exercises, STW and education on posture techniques with work activities, ADL's and lifting to protect back and avoid re-injury. Patient able to perform gentle posture exercises without discomfort and may be able to progress next week pending tolerance. Patient had palpable pain in mid right back along paraspinals into ribs. Patient goals ongoing at this time due to pain deficts.        Patient will benefit from skilled therapeutic intervention in order to improve the following  deficits and impairments:     Visit Diagnosis: Pain in thoracic spine     Problem List Patient Active Problem List   Diagnosis Date Noted  . Cesarean delivery, delivered, current hospitalization 12/11/2015  . Right ankle pain 07/17/2014  . Unspecified hypothyroidism 04/17/2012  . Reactive airways dysfunction syndrome (HCC) 04/17/2012    Rayen Dafoe P, PTA 10/28/2017, 10:37 AM  [facility] [address] [place], [place], [address] Phone: [phone]   Fax:  [phone]  Name: Rosemarie AxHeather L Slagter MRN: [id number] Date of Birth: 09/24/1990

## 2017-11-01 NOTE — Telephone Encounter (Signed)
Note was signed by Dr. Oswaldo DoneVincent, pt aware

## 2017-11-01 NOTE — Telephone Encounter (Signed)
Yes, that's fine 

## 2017-11-02 ENCOUNTER — Ambulatory Visit: Payer: 59 | Admitting: Physical Therapy

## 2017-11-02 ENCOUNTER — Encounter: Payer: Self-pay | Admitting: Physical Therapy

## 2017-11-02 DIAGNOSIS — M546 Pain in thoracic spine: Secondary | ICD-10-CM

## 2017-11-02 NOTE — Therapy (Signed)
[facility] [address] [place], [place], [address] Phone: [phone]   Fax:  [phone]  Physical Therapy Treatment  Patient Details  Name: JACKLYNE BAIK MRN: [id number] Date of Birth: 12-Feb-1991 Referring Provider: Rex Kras MD.   Encounter Date: 11/02/2017  PT End of Session - 11/02/17 1114    Visit Number  3    Number of Visits  12    Date for PT Re-Evaluation  12/06/17    PT Start Time  0945    PT Stop Time  1035    PT Time Calculation (min)  50 min    Activity Tolerance  Patient tolerated treatment well;Patient limited by pain    Behavior During Therapy  [facility] for tasks assessed/performed       Past Medical History:  Diagnosis Date  . Asthma   . Hypothyroidism   . PONV (postoperative nausea and vomiting)    nausea/vomiting with previous c section.    Past Surgical History:  Procedure Laterality Date  . CESAREAN SECTION    . CESAREAN SECTION N/A 12/11/2015   Procedure: CESAREAN SECTION;  Surgeon: Waynard Reeds, MD;  Location: [facility] BIRTHING SUITES;  Service: Obstetrics;  Laterality: N/A;    There were no vitals filed for this visit.  Subjective Assessment - 11/02/17 1107    Subjective  My pain is a 6/10.    Currently in Pain?  Yes    Pain Score  6     Pain Location  Rib cage    Pain Orientation  Right    Pain Descriptors / Indicators  Shooting;Sharp    Pain Type  Acute pain    Pain Onset  More than a month ago                       [facility] Adult PT Treatment/Exercise - 11/02/17 0001      Exercises   Exercises  Shoulder      Shoulder Exercises: ROM/Strengthening   UBE (Upper Arm Bike)  UBE x 8 minutes with cues for correct posture at 120 RPM's (4 minutes forward and 4 minutes backward).      Modalities   Modalities  Ultrasound      Moist Heat Therapy   Number Minutes Moist Heat  15 Minutes    Moist Heat Location  --   Thoracic.     Programme researcher, broadcasting/film/video  Location  Right mid-thoracic region.    Electrical Stimulation Action  IFC    Electrical Stimulation Parameters  80-150 Hz in prone x 15 minutes.    Electrical Stimulation Goals  Pain      Ultrasound   Ultrasound Location  Right mid-thoracic.    Ultrasound Parameters  Combo e'stim/U/S at 1.50 W/CM2 x 10 minutes.    Ultrasound Goals  Pain      Manual Therapy   Manual Therapy  Soft tissue mobilization    Soft tissue mobilization  In prone:  STW/M x 5 minutes to right mid-thoracic region.                  PT Long Term Goals - 10/28/17 1024      PT LONG TERM GOAL #1   Title  Independent with a HEP.    Time  6    Period  Weeks    Status  On-going      PT LONG TERM GOAL #2   Title  Perform ADL's with pain not > 2/10.  Time  6    Period  Weeks    Status  On-going      PT LONG TERM GOAL #3   Title  Return to work.    Time  6    Period  Weeks    Status  On-going      PT LONG TERM GOAL #4   Title  Walk a normal daily distance for patient with right ankle pain not > 2/10.    Time  6    Period  Weeks    Status  On-going            Plan - 11/02/17 1113    Clinical Impression Statement  Patient did well with tretament today but continues to have pain right of her mid-thoracic region and over the intercostals at these levels.    PT Treatment/Interventions  ADLs/Self Care Home Management;Cryotherapy;Electrical Stimulation;Ultrasound;Moist Heat;Therapeutic activities;Therapeutic exercise;Patient/family education;Manual techniques    PT Next Visit Plan  Progress with scapular strengthening exercises.    Consulted and Agree with Plan of Care  Patient       Patient will benefit from skilled therapeutic intervention in order to improve the following deficits and impairments:  Pain, Decreased activity tolerance  Visit Diagnosis: Pain in thoracic spine     Problem List Patient Active Problem List   Diagnosis Date Noted  . Cesarean delivery, delivered, current  hospitalization 12/11/2015  . Right ankle pain 07/17/2014  . Unspecified hypothyroidism 04/17/2012  . Reactive airways dysfunction syndrome (HCC) 04/17/2012    Mellony Danziger, [place] MPT 11/02/2017, 11:15 AM  [facility] [address] [place], [place], [address] Phone: [phone]   Fax:  [phone]  Name: Rosemarie AxHeather L Cartier MRN: [id number] Date of Birth: 09/02/1990

## 2017-11-04 ENCOUNTER — Encounter: Payer: Self-pay | Admitting: Physical Therapy

## 2017-11-04 ENCOUNTER — Ambulatory Visit: Payer: 59 | Admitting: Physical Therapy

## 2017-11-04 DIAGNOSIS — M546 Pain in thoracic spine: Secondary | ICD-10-CM

## 2017-11-04 NOTE — Therapy (Signed)
[facility] [address] [place], [place], [address] Phone: [phone]   Fax:  [phone]  Physical Therapy Treatment  Patient Details  Name: Sara Clarke MRN: [id number] Date of Birth: 06-May-1990 Referring Provider: Rex Kras MD.   Encounter Date: 11/04/2017  PT End of Session - 11/04/17 1020    Visit Number  4    Number of Visits  12    Date for PT Re-Evaluation  12/06/17    PT Start Time  0946    PT Stop Time  1033    PT Time Calculation (min)  47 min    Activity Tolerance  Patient tolerated treatment well    Behavior During Therapy  [facility] for tasks assessed/performed       Past Medical History:  Diagnosis Date  . Asthma   . Hypothyroidism   . PONV (postoperative nausea and vomiting)    nausea/vomiting with previous c section.    Past Surgical History:  Procedure Laterality Date  . CESAREAN SECTION    . CESAREAN SECTION N/A 12/11/2015   Procedure: CESAREAN SECTION;  Surgeon: Waynard Reeds, MD;  Location: [facility] BIRTHING SUITES;  Service: Obstetrics;  Laterality: N/A;    There were no vitals filed for this visit.  Subjective Assessment - 11/04/17 0954    Subjective  Patient reported ongoing pain up to 6 1/2 today    Pertinent History  Previous right ankle injury.    Diagnostic tests  (-) X-ray.    Currently in Pain?  Yes    Pain Score  6     Pain Location  Rib cage    Pain Orientation  Right    Pain Descriptors / Indicators  Discomfort;Sharp;Sore    Pain Type  Acute pain    Pain Onset  More than a month ago    Pain Frequency  Constant    Aggravating Factors   increased activity    Pain Relieving Factors  at rest                       [facility] Adult PT Treatment/Exercise - 11/04/17 0001      Exercises   Exercises  Shoulder      Shoulder Exercises: Standing   Horizontal ABduction  Strengthening;Both;20 reps;Theraband;Limitations    Theraband Level (Shoulder Horizontal ABduction)  Level 1  (Yellow)    Horizontal ABduction Limitations  grey ball on wall for resistance    Retraction  Strengthening;Both;20 reps   with grey ball for resistance on wall   Shoulder Elevation  Strengthening;Both;15 reps;5 reps;Standing   into "V" with grey ball on wall for resistance     Shoulder Exercises: ROM/Strengthening   UBE (Upper Arm Bike)  UBE x 6 minutes with cues for correct posture at 120 RPM's (4 minutes forward and 4 minutes backward).    Wall Pushups  20 reps      Moist Heat Therapy   Number Minutes Moist Heat  15 Minutes    Moist Heat Location  --   right mid back     Electrical Stimulation   Electrical Stimulation Location  Right mid-thoracic region.    Electrical Stimulation Action  IFC    Electrical Stimulation Parameters  80-150hz  x27min    Electrical Stimulation Goals  Pain      Ultrasound   Ultrasound Location  right mid thoracic    Ultrasound Parameters  1.5w/cm2/50% x54min    Ultrasound Goals  Pain  PT Long Term Goals - 10/28/17 1024      PT LONG TERM GOAL #1   Title  Independent with a HEP.    Time  6    Period  Weeks    Status  On-going      PT LONG TERM GOAL #2   Title  Perform ADL's with pain not > 2/10.    Time  6    Period  Weeks    Status  On-going      PT LONG TERM GOAL #3   Title  Return to work.    Time  6    Period  Weeks    Status  On-going      PT LONG TERM GOAL #4   Title  Walk a normal daily distance for patient with right ankle pain not > 2/10.    Time  6    Period  Weeks    Status  On-going            Plan - 11/04/17 1023    Clinical Impression Statement  Patient tolerated treatment well today and able to progress with posture exercises and scapular strengthening. Patient reported soreness from combo last treatment. Today performed US and heat/ES following exercises. Patient had normal response of removal of modalities. Goals ongoing at this time due to pain limitations.     Rehab Potential   Excellent    PT Frequency  2x / week    PT Duration  6 weeks    PT Treatment/Interventions  ADLs/Self Care Home Management;Cryotherapy;Electrical Stimulation;Ultrasound;Moist Heat;Therapeutic activities;Therapeutic exercise;Patient/family education;Manual techniques    PT Next Visit Plan  Progress with scapular strengthening exercises and modalities PRN    Consulted and Agree with Plan of Care  Patient       Patient will benefit from skilled therapeutic intervention in order to improve the following deficits and impairments:  Pain, Decreased activity tolerance  Visit Diagnosis: Pain in thoracic spine     Problem List Patient Active Problem List   Diagnosis Date Noted  . Cesarean delivery, delivered, current hospitalization 12/11/2015  . Right ankle pain 07/17/2014  . Unspecified hypothyroidism 04/17/2012  . Reactive airways dysfunction syndrome (HCC) 04/17/2012    Llewellyn Choplin P, PTA 11/04/2017, 10:34 AM  [facility] [address] [place], [place], [address] Phone: [phone]   Fax:  [phone]  Name: Sara Clarke MRN: [id number] Date of Birth: 05/04/1990

## 2017-11-05 ENCOUNTER — Ambulatory Visit: Payer: 59 | Admitting: Family

## 2017-11-08 ENCOUNTER — Encounter: Payer: Self-pay | Admitting: Pediatrics

## 2017-11-08 ENCOUNTER — Ambulatory Visit: Payer: 59 | Admitting: Pediatrics

## 2017-11-08 VITALS — BP 108/79 | HR 89 | Temp 98.1°F | Ht 61.0 in | Wt 188.0 lb

## 2017-11-08 DIAGNOSIS — M546 Pain in thoracic spine: Secondary | ICD-10-CM

## 2017-11-08 DIAGNOSIS — G8929 Other chronic pain: Secondary | ICD-10-CM

## 2017-11-08 DIAGNOSIS — R0781 Pleurodynia: Secondary | ICD-10-CM | POA: Diagnosis not present

## 2017-11-08 MED ORDER — NAPROXEN 500 MG PO TABS: 500.0000 mg | ORAL_TABLET | Freq: Two times a day (BID) | ORAL | 1 refills | Status: DC

## 2017-11-08 NOTE — Progress Notes (Signed)
  Subjective:   Patient ID: Sara Clarke, female    DOB: 10/14/1990, 27 y.o.   MRN: [id number] CC: Back Pain (Follow up)  HPI: Sara Clarke is a 27 y.o. female   Started physical therapy a couple weeks ago for back pain.  She thinks it has been helpful some.  She is walking more regularly than she used to as well.  Her back sometimes feels more sore after the physical therapy.  She is concerned that she has not noticed more improvement.  She is still not able to lift she needs to for work.  She is taking naproxen regularly, she is not sure if it is helping.  Relevant past medical, surgical, family and social history reviewed. Allergies and medications reviewed and updated. Social History   Tobacco Use  Smoking Status Never Smoker  Smokeless Tobacco Never Used   ROS: Per HPI   Objective:    BP 108/79   Pulse 89   Temp 98.1 F (36.7 C) (Oral)   Ht 5\' 1"  (1.549 m)   Wt 188 lb (85.3 kg)   BMI 35.52 kg/m   Wt Readings from Last 3 Encounters:  11/08/17 188 lb (85.3 kg)  10/13/17 187 lb 6.4 oz (85 kg)  10/06/17 187 lb 6.4 oz (85 kg)    Gen: NAD, alert, cooperative with exam, NCAT EYES: EOMI, no conjunctival injection, or no icterus CV: NRRR, normal S1/S2, no murmur, distal pulses 2+ b/l Resp: CTABL, no wheezes, normal WOB Ext: No edema, warm Neuro: Alert and oriented, coordination grossly normal MSK: No point tenderness over spine.  Mildly tender to palpation and soft tissue below Right scapula.   Assessment & Plan:  Sara Clarke was seen today for back pain.  Diagnoses and all orders for this visit:  Chronic right-sided thoracic back pain -     Ambulatory referral to Orthopedic Surgery  Rib pain on right side Take below as needed, especially on physical therapy days.  Continue physical therapy exercises at home as directed by PT -     naproxen (NAPROSYN) 500 MG tablet; Take 1 tablet (500 mg total) by mouth 2 (two) times daily with a meal.   Follow up plan: Return in  about 4 weeks (around 12/06/2017). Rex Krasarol Dhruti Ghuman, MD [facility]

## 2017-11-09 ENCOUNTER — Ambulatory Visit: Payer: 59 | Admitting: *Deleted

## 2017-11-09 DIAGNOSIS — M546 Pain in thoracic spine: Secondary | ICD-10-CM

## 2017-11-09 NOTE — Therapy (Signed)
[facility] [address] [place], [place], [address] Phone: [phone]   Fax:  [phone]  Physical Therapy Treatment  Patient Details  Name: TAUNA MACFARLANE MRN: [id number] Date of Birth: 06-Jul-1990 Referring Provider: Rex Kras MD.   Encounter Date: 11/09/2017  PT End of Session - 11/09/17 0951    Visit Number  5    Number of Visits  12    Date for PT Re-Evaluation  12/06/17    PT Start Time  0945    PT Stop Time  1035    PT Time Calculation (min)  50 min       Past Medical History:  Diagnosis Date  . Asthma   . Hypothyroidism   . PONV (postoperative nausea and vomiting)    nausea/vomiting with previous c section.    Past Surgical History:  Procedure Laterality Date  . CESAREAN SECTION    . CESAREAN SECTION N/A 12/11/2015   Procedure: CESAREAN SECTION;  Surgeon: Waynard Reeds, MD;  Location: [facility] BIRTHING SUIT[facility];  Service: Obstetrics;  Laterality: N/A;    There were no vitals filed for this visit.  Subjective Assessment - 11/09/17 0950    Subjective  Good after last Rx  . Today 4/10    Pertinent History  Previous right ankle injury.    Diagnostic tests  (-) X-ray.    Currently in Pain?  Yes    Pain Score  4     Pain Location  Rib cage    Pain Orientation  Right    Pain Descriptors / Indicators  Discomfort    Pain Type  Acute pain    Pain Onset  More than a month ago                       [facility] Adult PT Treatment/Exercise - 11/09/17 0001      Exercises   Exercises  Shoulder      Shoulder Exercises: Standing   Horizontal ABduction  Strengthening;Both;20 reps;Theraband;Limitations    Theraband Level (Shoulder Horizontal ABduction)  Level 1 (Yellow)    Retraction  Strengthening;Both;20 reps   with grey ball for resistance on wall   Shoulder Elevation  Strengthening;Both;15 reps;5 reps;Standing   into "V" with grey ball on wall for resistance     Shoulder Exercises: ROM/Strengthening   UBE  (Upper Arm Bike)  UBE x 6 minutes with cues for correct posture at 120 RPM's ( forward and 3 minutes backward).    Wall Pushups  20 reps                  PT Long Term Goals - 10/28/17 1024      PT LONG TERM GOAL #1   Title  Independent with a HEP.    Time  6    Period  Weeks    Status  On-going      PT LONG TERM GOAL #2   Title  Perform ADL's with pain not > 2/10.    Time  6    Period  Weeks    Status  On-going      PT LONG TERM GOAL #3   Title  Return to work.    Time  6    Period  Weeks    Status  On-going      PT LONG TERM GOAL #4   Title  Walk a normal daily distance for patient with right ankle pain not > 2/10.    Time  6    Period  Weeks    Status  On-going            Plan - 11/09/17 1645    Clinical Impression Statement  Ptarrived today doing fairly well, but still sore and having pain RT side inferior scapular aspect. She was able to perform strengthening and postural exs again with minimal increase in discomfort. Pt was still tender to palpation though RT side scapular border inferior aspect. Normal response to modalities today.    Clinical Decision Making  Low    Rehab Potential  Excellent    PT Frequency  2x / week    PT Treatment/Interventions  ADLs/Self Care Home Management;Cryotherapy;Electrical Stimulation;Ultrasound;Moist Heat;Therapeutic activities;Therapeutic exercise;Patient/family education;Manual techniques    PT Next Visit Plan  Progress with scapular strengthening exercises and modalities PRN       Patient will benefit from skilled therapeutic intervention in order to improve the following deficits and impairments:     Visit Diagnosis: Pain in thoracic spine     Problem List Patient Active Problem List   Diagnosis Date Noted  . Cesarean delivery, delivered, current hospitalization 12/11/2015  . Right ankle pain 07/17/2014  . Unspecified hypothyroidism 04/17/2012  . Reactive airways dysfunction syndrome (HCC)  04/17/2012    Caspar Favila,CHRIS , PTA 11/09/2017, 4:55 PM  [facility] [address] [place], [place], [address] Phone: [phone]   Fax:  [phone]  Name: Rosemarie AxHeather L Luellen MRN: [id number] Date of Birth: 08/19/1990

## 2017-11-11 ENCOUNTER — Ambulatory Visit: Payer: 59 | Admitting: Physical Therapy

## 2017-11-11 ENCOUNTER — Encounter: Payer: Self-pay | Admitting: Physical Therapy

## 2017-11-11 DIAGNOSIS — M546 Pain in thoracic spine: Secondary | ICD-10-CM | POA: Diagnosis not present

## 2017-11-11 NOTE — Therapy (Signed)
[facility] [address] [place], [place], [address] Phone: [phone]   Fax:  [phone]  Physical Therapy Treatment  Patient Details  Name: Sara Clarke MRN: [id number] Date of Birth: 28-Oct-1990 Referring Provider: Rex Kras MD.   Encounter Date: 11/11/2017  PT End of Session - 11/11/17 1025    Visit Number  6    Number of Visits  12    Date for PT Re-Evaluation  12/06/17    PT Start Time  0953    PT Stop Time  1035    PT Time Calculation (min)  42 min    Activity Tolerance  Patient tolerated treatment well    Behavior During Therapy  [facility] for tasks assessed/performed       Past Medical History:  Diagnosis Date  . Asthma   . Hypothyroidism   . PONV (postoperative nausea and vomiting)    nausea/vomiting with previous c section.    Past Surgical History:  Procedure Laterality Date  . CESAREAN SECTION    . CESAREAN SECTION N/A 12/11/2015   Procedure: CESAREAN SECTION;  Surgeon: Waynard Reeds, MD;  Location: [facility];  Service: Obstetrics;  Laterality: N/A;    There were no vitals filed for this visit.  Subjective Assessment - 11/11/17 0958    Subjective  Patient reported increased discomfort today.    Pertinent History  Previous right ankle injury.    Diagnostic tests  (-) X-ray.    Currently in Pain?  Yes    Pain Score  6     Pain Location  Rib cage    Pain Orientation  Right    Pain Descriptors / Indicators  Discomfort    Pain Type  Acute pain    Pain Onset  More than a month ago    Pain Frequency  Constant    Aggravating Factors   increased activity or overhead reach with right arm    Pain Relieving Factors  at rest                       [facility] Adult PT Treatment/Exercise - 11/11/17 0001      Exercises   Exercises  Shoulder      Shoulder Exercises: Standing   Horizontal ABduction  Strengthening;Both;20 reps;Theraband;Limitations    Theraband Level (Shoulder Horizontal  ABduction)  Level 1 (Yellow)    Retraction  Strengthening;Both;20 reps   with ball for resistance   Shoulder Elevation  Strengthening;Both;15 reps;5 reps;Standing   "V" with ball for resistance     Shoulder Exercises: ROM/Strengthening   Wall Pushups  20 reps   push up with plus     Shoulder Exercises: Stretch   Cross Chest Stretch  2 reps;10 seconds    Table Stretch - Flexion  3 reps;10 seconds    Other Shoulder Stretches  prone thoracic stretch, supine thoracic stretch with foam roll, chair thoracic stretch with soft foam roll   feeling some stretch yet not able to resolve the tight paini     Moist Heat Therapy   Number Minutes Moist Heat  15 Minutes    Moist Heat Location  Other (comment)   right mid back     Electrical Stimulation   Electrical Stimulation Location  Right mid-thoracic region.    Electrical Stimulation Action  IFC    Electrical Stimulation Parameters  80-150hz  x46min    Electrical Stimulation Goals  Pain      Manual Therapy   Manual Therapy  Soft tissue mobilization;Myofascial release    Manual therapy comments  gentle STW/MFR to mid back paraspinals and along ribs area of pain to reduce pain and tone                  PT Long Term Goals - 10/28/17 1024      PT LONG TERM GOAL #1   Title  Independent with a HEP.    Time  6    Period  Weeks    Status  On-going      PT LONG TERM GOAL #2   Title  Perform ADL's with pain not > 2/10.    Time  6    Period  Weeks    Status  On-going      PT LONG TERM GOAL #3   Title  Return to work.    Time  6    Period  Weeks    Status  On-going      PT LONG TERM GOAL #4   Title  Walk a normal daily distance for patient with right ankle pain not > 2/10.    Time  6    Period  Weeks    Status  On-going            Plan - 11/11/17 1026    Clinical Impression Statement  Patient tolerated treatment fair today. Patient reported 6/10 pain and feels very little improvement overall. Today focused on  stretches and strengthening followed by STW/MFR to right mid back area of discomfort. Patient reported 8/10 palpable pain in one area mid back esp. Patient has more discomfort with increased activity or overhead reach with right UE. Patient is to see an orthopedic MD on 11/25/17. Goals ongoing at this time due to limitations.     Rehab Potential  Excellent    PT Frequency  2x / week    PT Duration  6 weeks    PT Treatment/Interventions  ADLs/Self Care Home Management;Cryotherapy;Electrical Stimulation;Ultrasound;Moist Heat;Therapeutic activities;Therapeutic exercise;Patient/family education;Manual techniques    PT Next Visit Plan  Progress with scapular strengthening exercises and modalities PRN, consider PT scap mobs to thoracic area    Consulted and Agree with Plan of Care  Patient       Patient will benefit from skilled therapeutic intervention in order to improve the following deficits and impairments:  Pain, Decreased activity tolerance  Visit Diagnosis: Pain in thoracic spine     Problem List Patient Active Problem List   Diagnosis Date Noted  . Cesarean delivery, delivered, current hospitalization 12/11/2015  . Right ankle pain 07/17/2014  . Unspecified hypothyroidism 04/17/2012  . Reactive airways dysfunction syndrome (HCC) 04/17/2012    Jakiya Bookbinder P, PTA 11/11/2017, 10:38 AM  [facility] [address] [place], [place], [address] Phone: [phone]   Fax:  [phone]  Name: Sara Clarke MRN: [id number] Date of Birth: Mar 04, 1990

## 2017-11-18 ENCOUNTER — Ambulatory Visit: Payer: 59 | Attending: Pediatrics | Admitting: Physical Therapy

## 2017-11-18 ENCOUNTER — Encounter: Payer: Self-pay | Admitting: Physical Therapy

## 2017-11-18 DIAGNOSIS — M546 Pain in thoracic spine: Secondary | ICD-10-CM | POA: Diagnosis present

## 2017-11-18 NOTE — Therapy (Signed)
[facility] [place], [place], [address] Phone: [phone]   Fax:  [phone]  Physical Therapy Treatment  Patient Details  Name: Sara Clarke MRN: [id number] Date of Birth: 10/30/1990 Referring Provider (PT): Assunta Found MD.   Encounter Date: 11/18/2017  PT End of Session - 11/18/17 1020    Visit Number  7    Number of Visits  12    Date for PT Re-Evaluation  12/06/17    PT Start Time  0951    PT Stop Time  1032    PT Time Calculation (min)  41 min    Activity Tolerance  Patient tolerated treatment well    Behavior During Therapy  [facility] for tasks assessed/performed       Past Medical History:  Diagnosis Date  . Asthma   . Hypothyroidism   . PONV (postoperative nausea and vomiting)    nausea/vomiting with previous c section.    Past Surgical History:  Procedure Laterality Date  . CESAREAN SECTION    . CESAREAN SECTION N/A 12/11/2015   Procedure: CESAREAN SECTION;  Surgeon: Vanessa Kick, MD;  Location: [place];  Service: Obstetrics;  Laterality: N/A;    There were no vitals filed for this visit.  Subjective Assessment - 11/18/17 0952    Subjective  Patient arrived with increased discomfort in back, walked a lot yet unsure if that is wha caused increased pain    Pertinent History  Previous right ankle injury.    Diagnostic tests  (-) X-ray.    Currently in Pain?  Yes    Pain Score  8     Pain Location  Rib cage    Pain Orientation  Right    Pain Descriptors / Indicators  Discomfort;Sore    Pain Type  Acute pain    Pain Onset  More than a month ago    Aggravating Factors   increased activity    Pain Relieving Factors  at rest                       [facility] Adult PT Treatment/Exercise - 11/18/17 0001      Moist Heat Therapy   Number Minutes Moist Heat  15 Minutes    Moist Heat Location  Other (comment)   right mid back     Electrical Stimulation   Electrical Stimulation  Location  Right mid-thoracic region.    Electrical Stimulation Action  IFC    Electrical Stimulation Parameters  80-150hz x11mn    Electrical Stimulation Goals  Pain      Ultrasound   Ultrasound Location  right mid thoracic area    Ultrasound Parameters  1.5w/cm2/50%/144m x106m   Ultrasound Goals  Pain      Manual Therapy   Manual Therapy  Soft tissue mobilization;Myofascial release    Manual therapy comments  gentle STW/MFR to mid back paraspinals and along ribs area of pain to reduce pain and tone                  PT Long Term Goals - 10/28/17 1024      PT LONG TERM GOAL #1   Title  Independent with a HEP.    Time  6    Period  Weeks    Status  On-going      PT LONG TERM GOAL #2   Title  Perform ADL's with pain not > 2/10.    Time  6  Period  Weeks    Status  On-going      PT LONG TERM GOAL #3   Title  Return to work.    Time  6    Period  Weeks    Status  On-going      PT LONG TERM GOAL #4   Title  Walk a normal daily distance for patient with right ankle pain not > 2/10.    Time  6    Period  Weeks    Status  On-going            Plan - 11/18/17 1021    Clinical Impression Statement  Patient tolerated treatment well today. focused on conservative treatment today due to reported increased discomfort. Patient unsure of why her pain has increased. Patient has been walking a lot otherwise no reason reported. Patient reported palpable pain right mid thoracic area. No further goals met at this time.  Patient has a appt with ortho MD next week.     Rehab Potential  Excellent    PT Frequency  2x / week    PT Duration  6 weeks    PT Treatment/Interventions  ADLs/Self Care Home Management;Cryotherapy;Electrical Stimulation;Ultrasound;Moist Heat;Therapeutic activities;Therapeutic exercise;Patient/family education;Manual techniques    PT Next Visit Plan  assess then Progress with scapular strengthening exercises and modalities PRN, consider PT scap mobs  to thoracic area    Consulted and Agree with Plan of Care  Patient       Patient will benefit from skilled therapeutic intervention in order to improve the following deficits and impairments:  Pain, Decreased activity tolerance  Visit Diagnosis: Pain in thoracic spine     Problem List Patient Active Problem List   Diagnosis Date Noted  . Cesarean delivery, delivered, current hospitalization 12/11/2015  . Right ankle pain 07/17/2014  . Unspecified hypothyroidism 04/17/2012  . Reactive airways dysfunction syndrome ([place]) 04/17/2012    Makiyla Linch P, PTA 11/18/2017, 10:37 AM  [facility] [place], [place], [address] Phone: [phone]   Fax:  [phone]  Name: TRAMAINE SNELL MRN: [id number] Date of Birth: 1990/09/15

## 2017-11-22 ENCOUNTER — Ambulatory Visit: Payer: 59 | Admitting: Physical Therapy

## 2017-11-22 ENCOUNTER — Encounter: Payer: Self-pay | Admitting: Physical Therapy

## 2017-11-22 DIAGNOSIS — M546 Pain in thoracic spine: Secondary | ICD-10-CM

## 2017-11-22 NOTE — Patient Instructions (Addendum)
  Scapular Retraction: Bilateral  Facing anchor, pull arms back, bringing shoulder blades together. Repeat _20-30___ times per set. Do __1-2__ sets per session. Do _1___ sessions per day.     PNF Strengthening: Resisted    Standing, hold resistive band above head. Bring right/left arm down and out from side. Repeat _10___ times per set. Do __1-2__ sets per session. Do _1___ sessions per day.    Strengthening: Chest Pull - Resisted    With resistive band looped around each hand, and arms straight out in front, stretch band across chest. Repeat __10__ times per set. Do _1-2___ sets per session. Do _1___ sessions per day.

## 2017-11-22 NOTE — Therapy (Signed)
[facility] [place], [place], [address] Phone: [phone]   Fax:  [phone]  Physical Therapy Treatment  Patient Details  Name: Sara Clarke MRN: [id number] Date of Birth: 26-Aug-1990 Referring Provider (PT): Assunta Found MD.   Encounter Date: 11/22/2017  PT End of Session - 11/22/17 0935    Visit Number  8    Number of Visits  12    Date for PT Re-Evaluation  12/06/17    PT Start Time  0904    PT Stop Time  0947    PT Time Calculation (min)  43 min    Activity Tolerance  Patient tolerated treatment well    Behavior During Therapy  [facility] for tasks assessed/performed       Past Medical History:  Diagnosis Date  . Asthma   . Hypothyroidism   . PONV (postoperative nausea and vomiting)    nausea/vomiting with previous c section.    Past Surgical History:  Procedure Laterality Date  . CESAREAN SECTION    . CESAREAN SECTION N/A 12/11/2015   Procedure: CESAREAN SECTION;  Surgeon: Vanessa Kick, MD;  Location: [place];  Service: Obstetrics;  Laterality: N/A;    There were no vitals filed for this visit.  Subjective Assessment - 11/22/17 0906    Subjective  Patient arrived and reported "pain not as bad but i can feel it"    Pertinent History  Previous right ankle injury.    Diagnostic tests  (-) X-ray.    Currently in Pain?  Yes    Pain Score  5     Pain Location  Rib cage    Pain Orientation  Right    Pain Descriptors / Indicators  Discomfort;Sore    Pain Type  Acute pain    Pain Onset  More than a month ago    Pain Frequency  Constant    Aggravating Factors   any increased activity    Pain Relieving Factors  at rest                       [facility] Adult PT Treatment/Exercise - 11/22/17 0001      Exercises   Exercises  Shoulder      Shoulder Exercises: Standing   Row  Strengthening;Both;20 reps;Theraband    Theraband Level (Shoulder Row)  Other (comment)    Row Limitations  pink  XTS    Diagonals  Strengthening;Both;20 reps;Theraband    Theraband Level (Shoulder Diagonals)  Level 1 (Yellow)      Shoulder Exercises: ROM/Strengthening   UBE (Upper Arm Bike)  UBE x 6 minutes with cues for correct posture at 120 RPM's (75mnutes forward and 3 minutes backward).      Moist Heat Therapy   Number Minutes Moist Heat  15 Minutes    Moist Heat Location  Other (comment)   right mid back     Electrical Stimulation   Electrical Stimulation Location  Right mid-thoracic region.    Electrical Stimulation Action  premod    Electrical Stimulation Parameters  80-'150hz'  x12m    Electrical Stimulation Goals  Pain      Ultrasound   Ultrasound Location  right mid thoracic    Ultrasound Parameters  1.5w/cm2/50%/47m74mx10m30m  Ultrasound Goals  Pain             PT Education - 11/22/17 0940    Education Details  HEP    Person(s) Educated  Patient  Methods  Explanation;Demonstration;Handout    Comprehension  Verbalized understanding;Returned demonstration          PT Long Term Goals - 11/22/17 0941      PT LONG TERM GOAL #1   Title  Independent with a HEP.    Baseline  Met 11/22/17    Time  6    Period  Weeks    Status  Achieved   11/22/17     PT LONG TERM GOAL #2   Title  Perform ADL's with pain not > 2/10.    Time  6    Period  Weeks    Status  On-going   8/10 with ADL's 11/22/17     PT LONG TERM GOAL #3   Title  Return to work.    Time  6    Period  Weeks    Status  On-going   patient has not returned to work 11/22/17           Plan - 11/22/17 0944    Clinical Impression Statement  Patient tolerated treatment well today and able to progress with gentle exercises again today. HEP provided with good understanding of technique. Patient met LTG #1 today others ongoing due to limiations.     Rehab Potential  Excellent    PT Frequency  2x / week    PT Duration  6 weeks    PT Treatment/Interventions  ADLs/Self Care Home  Management;Cryotherapy;Electrical Stimulation;Ultrasound;Moist Heat;Therapeutic activities;Therapeutic exercise;Patient/family education;Manual techniques    PT Next Visit Plan  Progress with scapular strengthening exercises and modalities PRN, consider PT scap mobs to thoracic area    Consulted and Agree with Plan of Care  Patient       Patient will benefit from skilled therapeutic intervention in order to improve the following deficits and impairments:  Pain, Decreased activity tolerance  Visit Diagnosis: Pain in thoracic spine     Problem List Patient Active Problem List   Diagnosis Date Noted  . Cesarean delivery, delivered, current hospitalization 12/11/2015  . Right ankle pain 07/17/2014  . Unspecified hypothyroidism 04/17/2012  . Reactive airways dysfunction syndrome ([place]) 04/17/2012    Deamonte Sayegh P, PTA 11/22/2017, 9:51 AM  [facility] [place], [place], [address] Phone: [phone]   Fax:  [phone]  Name: Sara Clarke MRN: [id number] Date of Birth: Apr 29, 1990

## 2017-11-23 ENCOUNTER — Telehealth: Payer: Self-pay | Admitting: Pediatrics

## 2017-11-24 NOTE — Telephone Encounter (Signed)
Patient aware information has been faxed.

## 2017-11-25 ENCOUNTER — Encounter: Payer: Self-pay | Admitting: Physical Therapy

## 2017-11-25 ENCOUNTER — Ambulatory Visit: Payer: 59 | Admitting: Physical Therapy

## 2017-11-25 DIAGNOSIS — M545 Low back pain: Secondary | ICD-10-CM | POA: Diagnosis not present

## 2017-11-25 DIAGNOSIS — M546 Pain in thoracic spine: Secondary | ICD-10-CM | POA: Diagnosis not present

## 2017-11-25 NOTE — Therapy (Signed)
[facility] [place], [place], [address] Phone: [phone]   Fax:  [phone]  Physical Therapy Treatment  Patient Details  Name: Sara Clarke MRN: [id number] Date of Birth: 23-Jan-1991 Referring Provider (PT): Assunta Found MD.   Encounter Date: 11/25/2017  PT End of Session - 11/25/17 1022    Visit Number  9    Number of Visits  12    Date for PT Re-Evaluation  12/06/17    PT Start Time  0951    PT Stop Time  1033    PT Time Calculation (min)  42 min    Activity Tolerance  Patient tolerated treatment well    Behavior During Therapy  [facility] for tasks assessed/performed       Past Medical History:  Diagnosis Date  . Asthma   . Hypothyroidism   . PONV (postoperative nausea and vomiting)    nausea/vomiting with previous c section.    Past Surgical History:  Procedure Laterality Date  . CESAREAN SECTION    . CESAREAN SECTION N/A 12/11/2015   Procedure: CESAREAN SECTION;  Surgeon: Vanessa Kick, MD;  Location: [facility];  Service: Obstetrics;  Laterality: N/A;    There were no vitals filed for this visit.  Subjective Assessment - 11/25/17 0956    Subjective  Patient arrived and reported " doing good" no new complaints today    Pertinent History  Previous right ankle injury.    Diagnostic tests  (-) X-ray.    Currently in Pain?  Yes    Pain Score  4     Pain Location  Rib cage    Pain Orientation  Right    Pain Descriptors / Indicators  Discomfort    Pain Type  Acute pain    Pain Onset  More than a month ago    Aggravating Factors   increased activity    Pain Relieving Factors  at rest                       [facility] Adult PT Treatment/Exercise - 11/25/17 0001      Shoulder Exercises: Standing   Row  Strengthening;Both;20 reps;Theraband    Theraband Level (Shoulder Row)  Other (comment)    Row Limitations  pink XTS    Diagonals  Strengthening;Both;20 reps;Theraband    Theraband  Level (Shoulder Diagonals)  Level 1 (Yellow)      Shoulder Exercises: ROM/Strengthening   UBE (Upper Arm Bike)  UBE x 8 minutes with cues for correct posture at 120 RPM's (4 min forward and 4 min backward).      Moist Heat Therapy   Number Minutes Moist Heat  15 Minutes    Moist Heat Location  Other (comment)   mid thoracic     Electrical Stimulation   Electrical Stimulation Location  Right mid-thoracic region.    Electrical Stimulation Action  premod    Electrical Stimulation Parameters  80-'150hz' x43mn    Electrical Stimulation Goals  Pain      Ultrasound   Ultrasound Location  right mid thoracic    Ultrasound Parameters  1.5w/cm2/50%/120m x1040m   Ultrasound Goals  Pain                  PT Long Term Goals - 11/22/17 0941      PT LONG TERM GOAL #1   Title  Independent with a HEP.    Baseline  Met 11/22/17    Time  6  Period  Weeks    Status  Achieved   11/22/17     PT LONG TERM GOAL #2   Title  Perform ADL's with pain not > 2/10.    Time  6    Period  Weeks    Status  On-going   8/10 with ADL's 11/22/17     PT LONG TERM GOAL #3   Title  Return to work.    Time  6    Period  Weeks    Status  On-going   patient has not returned to work 11/22/17           Plan - 11/25/17 1003    Clinical Impression Statement  Patient continues to report progress and tolerated treatment well today. Patient has not been back to work at this time. Patient reported fatigue from prolong exercises and fatigue after todays exercises. No other exercises performed due to fatigue today. Patient reported that she is unable to lift for a prolong time due to increased soreness in thoracic area. Patient current goals ongoing at this time. Patient is appt with ortho MD today. Will progress per MD discression.     Rehab Potential  Excellent    PT Frequency  2x / week    PT Duration  6 weeks    PT Next Visit Plan  cont with POC per MD discression    Consulted and Agree with Plan of  Care  Patient       Patient will benefit from skilled therapeutic intervention in order to improve the following deficits and impairments:  Pain, Decreased activity tolerance  Visit Diagnosis: Pain in thoracic spine     Problem List Patient Active Problem List   Diagnosis Date Noted  . Cesarean delivery, delivered, current hospitalization 12/11/2015  . Right ankle pain 07/17/2014  . Unspecified hypothyroidism 04/17/2012  . Reactive airways dysfunction syndrome ([place]) 04/17/2012    Kirke Breach P, PTA 11/25/2017, 10:34 AM  [facility] [place], [place], [address] Phone: [phone]   Fax:  [phone]  Name: Sara Clarke MRN: [id number] Date of Birth: 19-May-1990

## 2017-11-26 ENCOUNTER — Telehealth: Payer: Self-pay | Admitting: Pediatrics

## 2017-12-01 ENCOUNTER — Encounter: Payer: Self-pay | Admitting: Physical Therapy

## 2017-12-01 ENCOUNTER — Ambulatory Visit: Payer: 59 | Admitting: Physical Therapy

## 2017-12-01 DIAGNOSIS — M546 Pain in thoracic spine: Secondary | ICD-10-CM

## 2017-12-01 NOTE — Therapy (Signed)
[facility] [address] [place], [place], [address] Phone: [phone]   Fax:  [phone]  Physical Therapy Treatment  Patient Details  Name: Sara Clarke MRN: [id number] Date of Birth: 11/02/1990 Referring Provider (PT): Carol Vincent MD.   Encounter Date: 12/01/2017  PT End of Session - 12/01/17 1107    Visit Number  10    Number of Visits  12    Date for PT Re-Evaluation  12/06/17    PT Start Time  1032    PT Stop Time  1116    PT Time Calculation (min)  44 min    Activity Tolerance  Patient tolerated treatment well    Behavior During Therapy  WFL for tasks assessed/performed       Past Medical History:  Diagnosis Date  . Asthma   . Hypothyroidism   . PONV (postoperative nausea and vomiting)    nausea/vomiting with previous c section.    Past Surgical History:  Procedure Laterality Date  . CESAREAN SECTION    . CESAREAN SECTION N/A 12/11/2015   Procedure: CESAREAN SECTION;  Surgeon: Kendra Ross, MD;  Location: [facility];  Service: Obstetrics;  Laterality: N/A;    There were no vitals filed for this visit.  Subjective Assessment - 12/01/17 1045    Subjective  Patient went to ortho and is to have MRI monday then F/U 12/17/17    Pertinent History  Previous right ankle injury.    Diagnostic tests  (-) X-ray.    Currently in Pain?  Yes    Pain Score  6     Pain Location  Rib cage    Pain Orientation  Right    Pain Descriptors / Indicators  Discomfort    Pain Type  Acute pain    Pain Onset  More than a month ago    Pain Frequency  Constant    Aggravating Factors   increased activity    Pain Relieving Factors  at rest                       [facility] Adult PT Treatment/Exercise - 12/01/17 0001      Shoulder Exercises: Standing   Row  Strengthening;Both;20 reps;Theraband    Theraband Level (Shoulder Row)  Other (comment)    Row Limitations  pink XTS    Diagonals  Strengthening;Both;20  reps;Theraband    Theraband Level (Shoulder Diagonals)  Level 1 (Yellow)      Shoulder Exercises: ROM/Strengthening   UBE (Upper Arm Bike)  UBE x 8 minutes with cues for correct posture at 120 RPM's (4 min forward and 4 min backward).      Shoulder Exercises: Stretch   Cross Chest Stretch  2 reps;10 seconds      Moist Heat Therapy   Number Minutes Moist Heat  15 Minutes    Moist Heat Location  Other (comment)   mid thoracic     Electrical Stimulation   Electrical Stimulation Location  Right mid-thoracic region.    Electrical Stimulation Action  premod    Electrical Stimulation Parameters  80-150hz x15min    Electrical Stimulation Goals  Pain      Ultrasound   Ultrasound Location  right mid thoracic    Ultrasound Parameters  1.5w/cm2/50%/1mhz x10min    Ultrasound Goals  Pain                  PT Long Term Goals - 12/01/17 1108        PT LONG TERM GOAL #1   Title  Independent with a HEP.    Baseline  Met 11/22/17    Time  6    Period  Weeks    Status  Achieved      PT LONG TERM GOAL #2   Title  Perform ADL's with pain not > 2/10.    Time  6    Period  Weeks    Status  Not Met   Pain up to 7-8/10 with ADL's 12/01/17     PT LONG TERM GOAL #3   Title  Return to work.    Time  6    Period  Weeks    Status  Not Met   Has not returned to work 12/01/17           Plan - 12/01/17 1110    Clinical Impression Statement  Patient continues to have increased discomfort in right mid thoracic area. Patient tolerated treatment well today yet has ongoing pain with any prolong activity with ADL's or work. Patient has not been back to work at this time. Patient able to complete all exercises with no difficulty. Patient unable to meet any other goals due to pain limiations. Patient went to ortho MD and will have MRI and then F/U 12/17/17. Patient will be on hold at this time pending MRI results.     Rehab Potential  Excellent    PT Frequency  2x / week    PT Duration  6  weeks    PT Treatment/Interventions  ADLs/Self Care Home Management;Cryotherapy;Electrical Stimulation;Ultrasound;Moist Heat;Therapeutic activities;Therapeutic exercise;Patient/family education;Manual techniques    PT Next Visit Plan  on hold pending MRI     Consulted and Agree with Plan of Care  Patient       Patient will benefit from skilled therapeutic intervention in order to improve the following deficits and impairments:  Pain, Decreased activity tolerance  Visit Diagnosis: Pain in thoracic spine     Problem List Patient Active Problem List   Diagnosis Date Noted  . Cesarean delivery, delivered, current hospitalization 12/11/2015  . Right ankle pain 07/17/2014  . Unspecified hypothyroidism 04/17/2012  . Reactive airways dysfunction syndrome [facility]) 04/17/2012    Ladean Raya, PTA 12/01/17 11:16 AM  [facility], [place], [address] Phone: [phone]   Fax:  [phone]  Name: Sara Clarke MRN: [id number] Date of Birth: 08-15-1990

## 2017-12-01 NOTE — Therapy (Addendum)
[facility] [address] [place], [place], [address] Phone: [phone]   Fax:  [phone]  Physical Therapy Treatment  Patient Details  Name: Sara Clarke MRN: [id number] Date of Birth: 10/14/1990 Referring Provider (PT): Carol Vincent MD.   Encounter Date: 12/01/2017  PT End of Session - 12/01/17 1107    Visit Number  10    Number of Visits  12    Date for PT Re-Evaluation  12/06/17    PT Start Time  1032    PT Stop Time  1116    PT Time Calculation (min)  44 min    Activity Tolerance  Patient tolerated treatment well    Behavior During Therapy  WFL for tasks assessed/performed       Past Medical History:  Diagnosis Date  . Asthma   . Hypothyroidism   . PONV (postoperative nausea and vomiting)    nausea/vomiting with previous c section.    Past Surgical History:  Procedure Laterality Date  . CESAREAN SECTION    . CESAREAN SECTION N/A 12/11/2015   Procedure: CESAREAN SECTION;  Surgeon: Kendra Ross, MD;  Location: [facility];  Service: Obstetrics;  Laterality: N/A;    There were no vitals filed for this visit.  Subjective Assessment - 12/01/17 1045    Subjective  Patient went to ortho and is to have MRI monday then F/U 12/17/17    Pertinent History  Previous right ankle injury.    Diagnostic tests  (-) X-ray.    Currently in Pain?  Yes    Pain Score  6     Pain Location  Rib cage    Pain Orientation  Right    Pain Descriptors / Indicators  Discomfort    Pain Type  Acute pain    Pain Onset  More than a month ago    Pain Frequency  Constant    Aggravating Factors   increased activity    Pain Relieving Factors  at rest                       [facility] Adult PT Treatment/Exercise - 12/01/17 0001      Shoulder Exercises: Standing   Row  Strengthening;Both;20 reps;Theraband    Theraband Level (Shoulder Row)  Other (comment)    Row Limitations  pink XTS    Diagonals  Strengthening;Both;20  reps;Theraband    Theraband Level (Shoulder Diagonals)  Level 1 (Yellow)      Shoulder Exercises: ROM/Strengthening   UBE (Upper Arm Bike)  UBE x 8 minutes with cues for correct posture at 120 RPM's (4 min forward and 4 min backward).      Shoulder Exercises: Stretch   Cross Chest Stretch  2 reps;10 seconds      Moist Heat Therapy   Number Minutes Moist Heat  15 Minutes    Moist Heat Location  Other (comment)   mid thoracic     Electrical Stimulation   Electrical Stimulation Location  Right mid-thoracic region.    Electrical Stimulation Action  premod    Electrical Stimulation Parameters  80-150hz x15min    Electrical Stimulation Goals  Pain      Ultrasound   Ultrasound Location  right mid thoracic    Ultrasound Parameters  1.5w/cm2/50%/1mhz x10min    Ultrasound Goals  Pain                  PT Long Term Goals - 12/01/17 1108        PT LONG TERM GOAL #1   Title  Independent with a HEP.    Baseline  Met 11/22/17    Time  6    Period  Weeks    Status  Achieved      PT LONG TERM GOAL #2   Title  Perform ADL's with pain not > 2/10.    Time  6    Period  Weeks    Status  Not Met   Pain up to 7-8/10 with ADL's 12/01/17     PT LONG TERM GOAL #3   Title  Return to work.    Time  6    Period  Weeks    Status  Not Met   Has not returned to work 12/01/17           Plan - 12/01/17 1110    Clinical Impression Statement  Patient continues to have increased discomfort in right mid thoracic area. Patient tolerated treatment well today yet has ongoing pain with any prolong activity with ADL's or work. Patient has not been back to work at this time. Patient able to complete all exercises with no difficulty. Patient unable to meet any other goals due to pain limiations. Patient went to ortho MD and will have MRI and then F/U 12/17/17. Patient will be on hold at this time pending MRI results.     Rehab Potential  Excellent    PT Frequency  2x / week    PT Duration  6  weeks    PT Treatment/Interventions  ADLs/Self Care Home Management;Cryotherapy;Electrical Stimulation;Ultrasound;Moist Heat;Therapeutic activities;Therapeutic exercise;Patient/family education;Manual techniques    PT Next Visit Plan  on hold pending MRI     Consulted and Agree with Plan of Care  Patient       Patient will benefit from skilled therapeutic intervention in order to improve the following deficits and impairments:  Pain, Decreased activity tolerance  Visit Diagnosis: Pain in thoracic spine     Problem List Patient Active Problem List   Diagnosis Date Noted  . Cesarean delivery, delivered, current hospitalization 12/11/2015  . Right ankle pain 07/17/2014  . Unspecified hypothyroidism 04/17/2012  . Reactive airways dysfunction syndrome [facility]) 04/17/2012    Ladean Raya, PTA 12/01/17 11:18 AM  [facility] [place], [place], [address] Phone: [phone]   Fax:  [phone]  Name: Sara Clarke MRN: [id number] Date of Birth: [birth year]  Progress Note Reporting Period 10/25/17 to 12/01/17.  See note below for Objective Data and Assessment of Progress/Goals. Patient will continued thoracic pain.  She is scheduled for an MRI.  Will hold treatments at this time.    Mali Applegate MPT     PHYSICAL THERAPY DISCHARGE SUMMARY  Visits from Start of Care: 10.  Current functional level related to goals / functional outcomes: See above.   Remaining deficits: Continued Thoracic pain.   Education / Equipment: HEP. Plan: Patient agrees to discharge.  Patient goals were not met. Patient is being discharged due to                                                     ?????         Mali Applegate MPT

## 2017-12-02 ENCOUNTER — Encounter: Payer: 59 | Admitting: Physical Therapy

## 2017-12-06 DIAGNOSIS — M546 Pain in thoracic spine: Secondary | ICD-10-CM | POA: Diagnosis not present

## 2017-12-15 ENCOUNTER — Ambulatory Visit: Payer: 59 | Admitting: Pediatrics

## 2017-12-17 DIAGNOSIS — M5134 Other intervertebral disc degeneration, thoracic region: Secondary | ICD-10-CM | POA: Diagnosis not present

## 2017-12-28 DIAGNOSIS — M546 Pain in thoracic spine: Secondary | ICD-10-CM | POA: Diagnosis not present

## 2018-01-04 DIAGNOSIS — M546 Pain in thoracic spine: Secondary | ICD-10-CM | POA: Diagnosis not present

## 2018-01-06 DIAGNOSIS — M546 Pain in thoracic spine: Secondary | ICD-10-CM | POA: Diagnosis not present

## 2018-01-17 DIAGNOSIS — M546 Pain in thoracic spine: Secondary | ICD-10-CM | POA: Diagnosis not present

## 2018-01-20 DIAGNOSIS — M546 Pain in thoracic spine: Secondary | ICD-10-CM | POA: Diagnosis not present

## 2018-01-28 DIAGNOSIS — M546 Pain in thoracic spine: Secondary | ICD-10-CM | POA: Diagnosis not present

## 2018-01-31 DIAGNOSIS — M546 Pain in thoracic spine: Secondary | ICD-10-CM | POA: Diagnosis not present

## 2018-02-02 DIAGNOSIS — M546 Pain in thoracic spine: Secondary | ICD-10-CM | POA: Diagnosis not present

## 2018-02-18 DIAGNOSIS — M546 Pain in thoracic spine: Secondary | ICD-10-CM | POA: Diagnosis not present

## 2018-03-23 ENCOUNTER — Ambulatory Visit: Payer: 59

## 2018-03-23 ENCOUNTER — Telehealth: Payer: Self-pay | Admitting: Pediatrics

## 2018-03-24 ENCOUNTER — Ambulatory Visit (INDEPENDENT_AMBULATORY_CARE_PROVIDER_SITE_OTHER): Payer: 59 | Admitting: *Deleted

## 2018-03-24 DIAGNOSIS — Z23 Encounter for immunization: Secondary | ICD-10-CM | POA: Diagnosis not present

## 2018-11-11 DIAGNOSIS — Z6837 Body mass index (BMI) 37.0-37.9, adult: Secondary | ICD-10-CM | POA: Diagnosis not present

## 2018-11-11 DIAGNOSIS — N760 Acute vaginitis: Secondary | ICD-10-CM | POA: Diagnosis not present

## 2018-11-11 DIAGNOSIS — Z124 Encounter for screening for malignant neoplasm of cervix: Secondary | ICD-10-CM | POA: Diagnosis not present

## 2018-11-11 DIAGNOSIS — Z01419 Encounter for gynecological examination (general) (routine) without abnormal findings: Secondary | ICD-10-CM | POA: Diagnosis not present

## 2019-04-18 DIAGNOSIS — H16042 Marginal corneal ulcer, left eye: Secondary | ICD-10-CM | POA: Diagnosis not present

## 2019-11-10 ENCOUNTER — Ambulatory Visit (INDEPENDENT_AMBULATORY_CARE_PROVIDER_SITE_OTHER): Payer: BC Managed Care – PPO | Admitting: Nurse Practitioner

## 2019-11-10 ENCOUNTER — Encounter: Payer: Self-pay | Admitting: Nurse Practitioner

## 2019-11-10 DIAGNOSIS — J0100 Acute maxillary sinusitis, unspecified: Secondary | ICD-10-CM | POA: Diagnosis not present

## 2019-11-10 MED ORDER — AMOXICILLIN-POT CLAVULANATE 875-125 MG PO TABS: 1.0000 | ORAL_TABLET | Freq: Two times a day (BID) | ORAL | 0 refills | Status: DC

## 2019-11-10 NOTE — Progress Notes (Signed)
Virtual Visit via telephone Note Due to COVID-19 pandemic this visit was conducted virtually. This visit type was conducted due to national recommendations for restrictions regarding the COVID-19 Pandemic (e.g. social distancing, sheltering in place) in an effort to limit this patient's exposure and mitigate transmission in our community. All issues noted in this document were discussed and addressed.  A physical exam was not performed with this format.  I connected with Sara Clarke on 11/10/19 at 3:25 by telephone and verified that I am speaking with the correct person using two identifiers. Sara Clarke is currently located at home and  No one is currently with  her during visit. The provider, Mary-Margaret Daphine Deutscher, FNP is located in their office at time of visit.  I discussed the limitations, risks, security and privacy concerns of performing an evaluation and management service by telephone and the availability of in person appointments. I also discussed with the patient that there may be a patient responsible charge related to this service. The patient expressed understanding and agreed to proceed.   History and Present Illness:   Chief Complaint: Sinusitis   HPI Patient c/o bil ear pain and nose burning. Slight facial pressure. Started over a week ago. She has been taking mucinex with no relief.    Review of Systems  Constitutional: Negative for fever.  HENT: Positive for congestion, ear pain and sinus pain. Negative for sore throat.   Respiratory: Positive for cough (occasionaly).   Neurological: Negative for dizziness and headaches.  Psychiatric/Behavioral: Negative.   All other systems reviewed and are negative.    Observations/Objective: Alert and oriented- answers all questions appropriately No distress Voice hoarse   Assessment and Plan: Sara Clarke in today with chief complaint of Sinusitis   1. Acute maxillary sinusitis, recurrence not specified 1. Take  meds as prescribed 2. Use a cool mist humidifier especially during the winter months and when heat has been humid. 3. Use saline nose sprays frequently 4. Saline irrigations of the nose can be very helpful if done frequently.  * 4X daily for 1 week*  * Use of a nettie pot can be helpful with this. Follow directions with this* 5. Drink plenty of fluids 6. Keep thermostat turn down low 7.For any cough or congestion  Use plain Mucinex- regular strength or max strength is fine   * Children- consult with Pharmacist for dosing 8. For fever or aces or pains- take tylenol or ibuprofen appropriate for age and weight.  * for fevers greater than 101 orally you may alternate ibuprofen and tylenol every  3 hours.    - amoxicillin-clavulanate (AUGMENTIN) 875-125 MG tablet; Take 1 tablet by mouth 2 (two) times daily.  Dispense: 14 tablet; Refill: 0    Follow Up Instructions: prn    I discussed the assessment and treatment plan with the patient. The patient was provided an opportunity to ask questions and all were answered. The patient agreed with the plan and demonstrated an understanding of the instructions.   The patient was advised to call back or seek an in-person evaluation if the symptoms worsen or if the condition fails to improve as anticipated.  The above assessment and management plan was discussed with the patient. The patient verbalized understanding of and has agreed to the management plan. Patient is aware to call the clinic if symptoms persist or worsen. Patient is aware when to return to the clinic for a follow-up visit. Patient educated on when it is appropriate to go  to the emergency department.   Time call ended:  3:40  I provided 15 minutes of non-face-to-face time during this encounter.    Mary-Margaret Daphine Deutscher, FNP

## 2019-11-28 DIAGNOSIS — Z6837 Body mass index (BMI) 37.0-37.9, adult: Secondary | ICD-10-CM | POA: Diagnosis not present

## 2019-11-28 DIAGNOSIS — Z124 Encounter for screening for malignant neoplasm of cervix: Secondary | ICD-10-CM | POA: Diagnosis not present

## 2019-11-28 DIAGNOSIS — E559 Vitamin D deficiency, unspecified: Secondary | ICD-10-CM | POA: Diagnosis not present

## 2019-11-28 DIAGNOSIS — Z01419 Encounter for gynecological examination (general) (routine) without abnormal findings: Secondary | ICD-10-CM | POA: Diagnosis not present

## 2019-11-28 DIAGNOSIS — E039 Hypothyroidism, unspecified: Secondary | ICD-10-CM | POA: Diagnosis not present

## 2020-01-16 ENCOUNTER — Other Ambulatory Visit: Payer: Self-pay | Admitting: Nurse Practitioner

## 2020-01-16 MED ORDER — VALACYCLOVIR HCL 1 G PO TABS: 2000.0000 mg | ORAL_TABLET | Freq: Two times a day (BID) | ORAL | 1 refills | Status: AC

## 2020-01-16 NOTE — Progress Notes (Signed)
valtres rx sent o pharmacy

## 2020-02-14 DIAGNOSIS — E039 Hypothyroidism, unspecified: Secondary | ICD-10-CM | POA: Diagnosis not present

## 2020-03-22 ENCOUNTER — Telehealth (INDEPENDENT_AMBULATORY_CARE_PROVIDER_SITE_OTHER): Payer: BC Managed Care – PPO | Admitting: Nurse Practitioner

## 2020-03-22 ENCOUNTER — Telehealth: Payer: Self-pay

## 2020-03-22 DIAGNOSIS — J0110 Acute frontal sinusitis, unspecified: Secondary | ICD-10-CM | POA: Diagnosis not present

## 2020-03-22 MED ORDER — DM-GUAIFENESIN ER 30-600 MG PO TB12: 1.0000 | ORAL_TABLET | Freq: Two times a day (BID) | ORAL | 0 refills | Status: DC

## 2020-03-22 MED ORDER — LEVOTHYROXINE SODIUM 75 MCG PO TABS: 75.0000 ug | ORAL_TABLET | Freq: Every day | ORAL | 3 refills | Status: DC

## 2020-03-22 MED ORDER — AZITHROMYCIN 250 MG PO TABS: ORAL_TABLET | ORAL | 0 refills | Status: DC

## 2020-03-22 NOTE — Assessment & Plan Note (Signed)
Symptoms are new in the last 3 to 4 days and not well controlled.  Patient is reporting fever of 101.5, headache, cough, and sore throat.  COVID-19 swab ordered results pending. Started patient on guaifenesin for cough and chest congestion, and azithromycin.  Education provided patient verbalized understanding.  Rx sent to pharmacy.  Advised patient to follow-up with worsening or unresolved symptoms.

## 2020-03-22 NOTE — Patient Instructions (Signed)

## 2020-03-22 NOTE — Progress Notes (Addendum)
Acute Office Visit  Subjective:    Patient ID: Sara Clarke, female    DOB: 01-09-1991, 29 y.o.   MRN: [id number]  Chief Complaint  Patient presents with  . Sore Throat   Patient was seen via telehealth at 10 AM.  Patient was located at home at the time of visit.  Provider was located in the clinic.   Sore Throat  This is a new problem. Episode onset: in the past 4 days. The problem has been gradually worsening. Neither side of throat is experiencing more pain than the other. Maximum temperature: 100.1. The fever has been present for 1 to 2 days. The pain is moderate. Associated symptoms include coughing. Pertinent negatives include no congestion, drooling, shortness of breath, swollen glands or trouble swallowing. Associated symptoms comments: Nasal drainage. She has had no exposure to strep. She has tried nothing for the symptoms.     Past Medical History:  Diagnosis Date  . Asthma   . Hypothyroidism   . PONV (postoperative nausea and vomiting)    nausea/vomiting with previous c section.    Past Surgical History:  Procedure Laterality Date  . CESAREAN SECTION    . CESAREAN SECTION N/A 12/11/2015   Procedure: CESAREAN SECTION;  Surgeon: Waynard Reeds, MD;  Location: [facility];  Service: Obstetrics;  Laterality: N/A;    Family History  Problem Relation Age of Onset  . Thyroid disease Mother   . Hypertension Mother   . Hypertension Father     Social History   Socioeconomic History  . Marital status: Married    Spouse name: Not on file  . Number of children: Not on file  . Years of education: Not on file  . Highest education level: Not on file  Occupational History  . Not on file  Tobacco Use  . Smoking status: Never Smoker  . Smokeless tobacco: Never Used  Substance and Sexual Activity  . Alcohol use: No    Alcohol/week: 0.0 standard drinks  . Drug use: No  . Sexual activity: Yes    Birth control/protection: None  Other Topics Concern  . Not on  file  Social History Narrative  . Not on file   Social Determinants of Health   Financial Resource Strain: Not on file  Food Insecurity: Not on file  Transportation Needs: Not on file  Physical Activity: Not on file  Stress: Not on file  Social Connections: Not on file  Intimate Partner Violence: Not on file    Outpatient Medications Prior to Visit  Medication Sig Dispense Refill  . amoxicillin-clavulanate (AUGMENTIN) 875-125 MG tablet Take 1 tablet by mouth 2 (two) times daily. 14 tablet 0  . clotrimazole-betamethasone (LOTRISONE) cream Apply 1 application topically 2 (two) times daily. 45 g 0  . cyclobenzaprine (FLEXERIL) 5 MG tablet Take 1 tablet (5 mg total) by mouth 3 (three) times daily as needed for muscle spasms. 45 tablet 1  . drospirenone-ethinyl estradiol (YAZ,GIANVI,LORYNA) 3-0.02 MG tablet Take 1 tablet by mouth daily.    Marland Kitchen levothyroxine (SYNTHROID, LEVOTHROID) 50 MCG tablet levothyroxine 50 mcg tablet    . naproxen (NAPROSYN) 500 MG tablet Take 1 tablet (500 mg total) by mouth 2 (two) times daily with a meal. 60 tablet 1   No facility-administered medications prior to visit.     Review of Systems  Constitutional: Positive for chills and fever.  HENT: Positive for sore throat. Negative for congestion, drooling and trouble swallowing.   Respiratory: Positive for cough. Negative for  shortness of breath.   Cardiovascular: Negative.   Gastrointestinal: Negative.   Genitourinary: Negative.   Musculoskeletal: Negative.   Skin: Negative.   All other systems reviewed and are negative.      Objective:    Physical Exam  Tele-visit, patient doesn't sound to be in distress   Wt Readings from Last 3 Encounters:  11/08/17 188 lb (85.3 kg)  10/13/17 187 lb 6.4 oz (85 kg)  10/06/17 187 lb 6.4 oz (85 kg)    Health Maintenance Due  Topic Date Due  . Hepatitis C Screening  Never done  . COVID-19 Vaccine (1) Never done  . PAP SMEAR-Modifier  07/25/2018  . INFLUENZA  VACCINE  09/17/2019    There are no preventive care reminders to display for this patient.   No results found for: TSH Lab Results  Component Value Date   WBC 11.6 (H) 12/12/2015   HGB 9.4 (L) 12/12/2015   HCT 28.7 (L) 12/12/2015   MCV 80.6 12/12/2015   PLT 185 12/12/2015   Lab Results  Component Value Date   NA 135 10/06/2015   K 3.7 10/06/2015   CO2 24 10/06/2015   GLUCOSE 85 10/06/2015   BUN <5 (L) 10/06/2015   CREATININE 0.50 10/06/2015   BILITOT 0.7 06/05/2009   ALKPHOS 152 (H) 06/05/2009   AST 21 06/05/2009   ALT 18 06/05/2009   PROT 6.3 06/05/2009   ALBUMIN 2.1 (L) 06/05/2009   CALCIUM 8.7 (L) 10/06/2015   ANIONGAP 7 10/06/2015       Assessment & Plan:   Problem List Items Addressed This Visit      Respiratory   Subacute frontal sinusitis - Primary    Symptoms are new in the last 3 to 4 days and not well controlled.  Patient is reporting fever of 101.5, headache, cough, and sore throat.  COVID-19 swab ordered results pending. Started patient on guaifenesin for cough and chest congestion, and azithromycin.  Education provided patient verbalized understanding.  Rx sent to pharmacy.  Advised patient to follow-up with worsening or unresolved symptoms.      Relevant Medications   dextromethorphan-guaiFENesin (MUCINEX DM) 30-600 MG 12hr tablet   levothyroxine (SYNTHROID) 75 MCG tablet   azithromycin (ZITHROMAX) 250 MG tablet   Other Relevant Orders   Novel Coronavirus, NAA ([facility])       Meds ordered this encounter  Medications  . dextromethorphan-guaiFENesin (MUCINEX DM) 30-600 MG 12hr tablet    Sig: Take 1 tablet by mouth 2 (two) times daily.    Dispense:  30 tablet    Refill:  0    Order Specific Question:   Supervising Provider    Answer:   Raliegh Ip [[id number]]  . levothyroxine (SYNTHROID) 75 MCG tablet    Sig: Take 1 tablet (75 mcg total) by mouth daily.    Dispense:  90 tablet    Refill:  3    Order Specific Question:    Supervising Provider    Answer:   Raliegh Ip [[id number]]  . azithromycin (ZITHROMAX) 250 MG tablet    Sig: 2 tablets [500 mg] day 1, 1 tablet [250 mg tablet] day 2-5    Dispense:  1 each    Refill:  0    Order Specific Question:   Supervising Provider    Answer:   Raliegh Ip [[id number]]     Call ended at 10:09 am  I provided a total of 9 minutes of phone time.  Daryll Drown, NP

## 2020-03-23 LAB — NOVEL CORONAVIRUS, NAA: SARS-CoV-2, NAA: DETECTED — AB

## 2020-03-23 LAB — SARS-COV-2, NAA 2 DAY TAT

## 2020-03-26 ENCOUNTER — Encounter: Payer: Self-pay | Admitting: Nurse Practitioner

## 2020-03-26 ENCOUNTER — Other Ambulatory Visit: Payer: Self-pay | Admitting: Nurse Practitioner

## 2020-03-26 DIAGNOSIS — J069 Acute upper respiratory infection, unspecified: Secondary | ICD-10-CM

## 2020-03-27 ENCOUNTER — Encounter: Payer: Self-pay | Admitting: Family Medicine

## 2020-03-27 ENCOUNTER — Ambulatory Visit (INDEPENDENT_AMBULATORY_CARE_PROVIDER_SITE_OTHER): Payer: BC Managed Care – PPO

## 2020-03-27 ENCOUNTER — Ambulatory Visit (INDEPENDENT_AMBULATORY_CARE_PROVIDER_SITE_OTHER): Payer: BC Managed Care – PPO | Admitting: Family Medicine

## 2020-03-27 VITALS — BP 140/92 | HR 112 | Temp 98.0°F

## 2020-03-27 DIAGNOSIS — J069 Acute upper respiratory infection, unspecified: Secondary | ICD-10-CM

## 2020-03-27 DIAGNOSIS — R059 Cough, unspecified: Secondary | ICD-10-CM | POA: Diagnosis not present

## 2020-03-27 DIAGNOSIS — U071 COVID-19: Secondary | ICD-10-CM

## 2020-03-27 DIAGNOSIS — R0602 Shortness of breath: Secondary | ICD-10-CM | POA: Diagnosis not present

## 2020-03-27 MED ORDER — AMOXICILLIN-POT CLAVULANATE 875-125 MG PO TABS: 1.0000 | ORAL_TABLET | Freq: Two times a day (BID) | ORAL | 0 refills | Status: AC

## 2020-03-27 MED ORDER — ALBUTEROL SULFATE HFA 108 (90 BASE) MCG/ACT IN AERS: 2.0000 | INHALATION_SPRAY | Freq: Four times a day (QID) | RESPIRATORY_TRACT | 2 refills | Status: DC | PRN

## 2020-03-27 MED ORDER — DEXAMETHASONE 6 MG PO TABS: 6.0000 mg | ORAL_TABLET | Freq: Two times a day (BID) | ORAL | 0 refills | Status: AC

## 2020-03-27 NOTE — Progress Notes (Signed)
Established Patient Office Visit  Subjective:  Patient ID: Sara Clarke, female    DOB: 1990-12-27  Age: 30 y.o. MRN: [id number]  CC:  Chief Complaint  Patient presents with  . Shortness of Breath  . Cough    HPI Sara Clarke presents for Covid 19 infection. She started having symptoms and tested positive on 03/22/20. She initially had a sore throat, headache, and fever which have all resolved. She reports cough, congestion, and shortness of breath that have not improved. Shortess of breath seems to be worse. It is mild but it feels like she constantly cannot get a good deep breath. She reports chest tightness and occasional but denies chest pain. Her cough is dry. She was given azithromycin and mucinex without improvement. She has had pneumonia in the past and is concerned that she may have pneumonia again. She used to have an albuterol inhaler but no longer does.   Past Medical History:  Diagnosis Date  . Asthma   . Hypothyroidism   . PONV (postoperative nausea and vomiting)    nausea/vomiting with previous c section.    Past Surgical History:  Procedure Laterality Date  . CESAREAN SECTION    . CESAREAN SECTION N/A 12/11/2015   Procedure: CESAREAN SECTION;  Surgeon: Waynard Reeds, MD;  Location: [facility];  Service: Obstetrics;  Laterality: N/A;    Family History  Problem Relation Age of Onset  . Thyroid disease Mother   . Hypertension Mother   . Hypertension Father     Social History   Socioeconomic History  . Marital status: Married    Spouse name: Not on file  . Number of children: Not on file  . Years of education: Not on file  . Highest education level: Not on file  Occupational History  . Not on file  Tobacco Use  . Smoking status: Never Smoker  . Smokeless tobacco: Never Used  Substance and Sexual Activity  . Alcohol use: No    Alcohol/week: 0.0 standard drinks  . Drug use: No  . Sexual activity: Yes    Birth control/protection: None  Other  Topics Concern  . Not on file  Social History Narrative  . Not on file   Social Determinants of Health   Financial Resource Strain: Not on file  Food Insecurity: Not on file  Transportation Needs: Not on file  Physical Activity: Not on file  Stress: Not on file  Social Connections: Not on file  Intimate Partner Violence: Not on file    Outpatient Medications Prior to Visit  Medication Sig Dispense Refill  . clotrimazole-betamethasone (LOTRISONE) cream Apply 1 application topically 2 (two) times daily. 45 g 0  . cyclobenzaprine (FLEXERIL) 5 MG tablet Take 1 tablet (5 mg total) by mouth 3 (three) times daily as needed for muscle spasms. 45 tablet 1  . dextromethorphan-guaiFENesin (MUCINEX DM) 30-600 MG 12hr tablet Take 1 tablet by mouth 2 (two) times daily. 30 tablet 0  . drospirenone-ethinyl estradiol (YAZ,GIANVI,LORYNA) 3-0.02 MG tablet Take 1 tablet by mouth daily.    Marland Kitchen levothyroxine (SYNTHROID) 75 MCG tablet Take 1 tablet (75 mcg total) by mouth daily. 90 tablet 3  . naproxen (NAPROSYN) 500 MG tablet Take 1 tablet (500 mg total) by mouth 2 (two) times daily with a meal. 60 tablet 1  . amoxicillin-clavulanate (AUGMENTIN) 875-125 MG tablet Take 1 tablet by mouth 2 (two) times daily. 14 tablet 0  . azithromycin (ZITHROMAX) 250 MG tablet 2 tablets [500 mg] day 1,  1 tablet [250 mg tablet] day 2-5 1 each 0   No facility-administered medications prior to visit.    No Known Allergies  ROS Review of Systems As per HPI.    Objective:    Physical Exam Vitals and nursing note reviewed.  Constitutional:      General: She is not in acute distress.    Appearance: She is not ill-appearing, toxic-appearing or diaphoretic.  HENT:     Head: Normocephalic and atraumatic.     Mouth/Throat:     Mouth: Mucous membranes are moist.     Pharynx: No pharyngeal swelling or oropharyngeal exudate.  Eyes:     Pupils: Pupils are equal, round, and reactive to light.  Neck:     Vascular: No JVD.   Cardiovascular:     Rate and Rhythm: Normal rate and regular rhythm.  Pulmonary:     Effort: Pulmonary effort is normal. No tachypnea or accessory muscle usage.     Breath sounds: Normal breath sounds. No decreased breath sounds, wheezing, rhonchi or rales.  Chest:     Chest wall: No tenderness or edema.  Musculoskeletal:     Cervical back: Neck supple.     Right lower leg: No edema.     Left lower leg: No edema.  Lymphadenopathy:     Cervical: No cervical adenopathy.  Skin:    General: Skin is warm and dry.  Neurological:     General: No focal deficit present.     Mental Status: She is alert and oriented to person, place, and time.  Psychiatric:        Mood and Affect: Mood normal.        Behavior: Behavior normal.     BP (!) 140/92   Pulse (!) 112   Temp 98 F (36.7 C)   SpO2 100%  Wt Readings from Last 3 Encounters:  11/08/17 188 lb (85.3 kg)  10/13/17 187 lb 6.4 oz (85 kg)  10/06/17 187 lb 6.4 oz (85 kg)     Health Maintenance Due  Topic Date Due  . Hepatitis C Screening  Never done  . COVID-19 Vaccine (1) Never done  . PAP SMEAR-Modifier  07/25/2018  . INFLUENZA VACCINE  09/17/2019    There are no preventive care reminders to display for this patient.  No results found for: TSH Lab Results  Component Value Date   WBC 11.6 (H) 12/12/2015   HGB 9.4 (L) 12/12/2015   HCT 28.7 (L) 12/12/2015   MCV 80.6 12/12/2015   PLT 185 12/12/2015   Lab Results  Component Value Date   NA 135 10/06/2015   K 3.7 10/06/2015   CO2 24 10/06/2015   GLUCOSE 85 10/06/2015   BUN <5 (L) 10/06/2015   CREATININE 0.50 10/06/2015   BILITOT 0.7 06/05/2009   ALKPHOS 152 (H) 06/05/2009   AST 21 06/05/2009   ALT 18 06/05/2009   PROT 6.3 06/05/2009   ALBUMIN 2.1 (L) 06/05/2009   CALCIUM 8.7 (L) 10/06/2015   ANIONGAP 7 10/06/2015   No results found for: CHOL No results found for: HDL No results found for: LDLCALC No results found for: TRIG No results found for: CHOLHDL No  results found for: HGBA1C    Assessment & Plan:   Shron was seen today for shortness of breath and cough.  Diagnoses and all orders for this visit:  Shortness of breath/COVID-19 Further xray order was placed during phone visit yesterday. CXR obtained today in office, ? Pneumonia. Radiology report pending. Start  augment and dexamethasone for possible pneumonia. O2 sat 100% in office. Lungs clear on auscultation and patient is able to speak in full sentences and ambulation without obvious shortness of breath. Continue mucinex, nasal saline, and cough syrup. Return to office for new or worsening symptoms, or if symptoms persist.  -     amoxicillin-clavulanate (AUGMENTIN) 875-125 MG tablet; Take 1 tablet by mouth 2 (two) times daily for 10 days. -     dexamethasone (DECADRON) 6 MG tablet; Take 1 tablet (6 mg total) by mouth 2 (two) times daily with a meal for 5 days. -     albuterol (VENTOLIN HFA) 108 (90 Base) MCG/ACT inhaler; Inhale 2 puffs into the lungs every 6 (six) hours as needed for wheezing or shortness of breath.   Follow-up: Return if symptoms worsen or fail to improve.   The patient indicates understanding of these issues and agrees with the plan.  Gabriel Earing, FNP

## 2020-05-03 ENCOUNTER — Encounter: Payer: Self-pay | Admitting: Family Medicine

## 2020-05-03 ENCOUNTER — Ambulatory Visit: Payer: BC Managed Care – PPO | Admitting: Family Medicine

## 2020-05-03 DIAGNOSIS — J40 Bronchitis, not specified as acute or chronic: Secondary | ICD-10-CM

## 2020-05-03 DIAGNOSIS — R059 Cough, unspecified: Secondary | ICD-10-CM | POA: Diagnosis not present

## 2020-05-03 MED ORDER — BUDESONIDE-FORMOTEROL FUMARATE 80-4.5 MCG/ACT IN AERO: 2.0000 | INHALATION_SPRAY | Freq: Two times a day (BID) | RESPIRATORY_TRACT | 12 refills | Status: DC

## 2020-05-03 MED ORDER — BENZONATATE 100 MG PO CAPS: 100.0000 mg | ORAL_CAPSULE | Freq: Three times a day (TID) | ORAL | 0 refills | Status: DC | PRN

## 2020-05-03 NOTE — Progress Notes (Signed)
   Virtual Visit via telephone Note Due to COVID-19 pandemic this visit was conducted virtually. This visit type was conducted due to national recommendations for restrictions regarding the COVID-19 Pandemic (e.g. social distancing, sheltering in place) in an effort to limit this patient's exposure and mitigate transmission in our community. All issues noted in this document were discussed and addressed.  A physical exam was not performed with this format.  I connected with Elta L Potteiger on 05/03/20 at 1048 by telephone and verified that I am speaking with the correct person using two identifiers. Adriannah L Tiley is currently located at home and no one is currently with her during visit. The provider, Gabriel Earing, FNP is located in their office at time of visit.  I discussed the limitations, risks, security and privacy concerns of performing an evaluation and management service by telephone and the availability of in person appointments. I also discussed with the patient that there may be a patient responsible charge related to this service. The patient expressed understanding and agreed to proceed.  CC: cough  History and Present Illness:  HPI  Lamekia had Covid last month. She had a CXR about 6 week ago that was normal. She was treat with augmentin and dexamethasone. She reports that she has had a cough since having Covid, however it seems to have worsened over the last few weeks. She reports that her cough is dry. She does have some wheezing when she takes a deep breath. She denies shortness of breath, fever, or chills. She has an albuterol inhaler at home but she has not used it.     ROS As per HPI.    Observations/Objective: Alert and oriented x 3. Able to speak in full sentences without difficulty.   Assessment and Plan: Dashay was seen today for cough.  Diagnoses and all orders for this visit:  Bronchitis with wheezing Start symbicort. Use albuterol as needed. Return to  office for new or worsening symptoms, or if symptoms persist.  -     budesonide-formoterol (SYMBICORT) 80-4.5 MCG/ACT inhaler; Inhale 2 puffs into the lungs 2 (two) times daily.  Cough Tessalon perles as needed.  -     benzonatate (TESSALON PERLES) 100 MG capsule; Take 1 capsule (100 mg total) by mouth 3 (three) times daily as needed for cough. -     budesonide-formoterol (SYMBICORT) 80-4.5 MCG/ACT inhaler; Inhale 2 puffs into the lungs 2 (two) times daily.   Follow Up Instructions: Return to office for new or worsening symptoms, or if symptoms persist.     I discussed the assessment and treatment plan with the patient. The patient was provided an opportunity to ask questions and all were answered. The patient agreed with the plan and demonstrated an understanding of the instructions.   The patient was advised to call back or seek an in-person evaluation if the symptoms worsen or if the condition fails to improve as anticipated.  The above assessment and management plan was discussed with the patient. The patient verbalized understanding of and has agreed to the management plan. Patient is aware to call the clinic if symptoms persist or worsen. Patient is aware when to return to the clinic for a follow-up visit. Patient educated on when it is appropriate to go to the emergency department.   Time call ended:  1102  I provided 13 minutes of non-face-to-face time during this encounter.    Gabriel Earing, FNP

## 2020-09-25 DIAGNOSIS — M84374A Stress fracture, right foot, initial encounter for fracture: Secondary | ICD-10-CM | POA: Diagnosis not present

## 2020-10-31 DIAGNOSIS — M84374D Stress fracture, right foot, subsequent encounter for fracture with routine healing: Secondary | ICD-10-CM | POA: Diagnosis not present

## 2020-11-14 DIAGNOSIS — M79671 Pain in right foot: Secondary | ICD-10-CM | POA: Diagnosis not present

## 2020-11-18 DIAGNOSIS — M7671 Peroneal tendinitis, right leg: Secondary | ICD-10-CM | POA: Diagnosis not present

## 2020-12-13 DIAGNOSIS — M79671 Pain in right foot: Secondary | ICD-10-CM | POA: Diagnosis not present

## 2021-01-24 DIAGNOSIS — R8290 Unspecified abnormal findings in urine: Secondary | ICD-10-CM | POA: Diagnosis not present

## 2021-01-24 DIAGNOSIS — Z113 Encounter for screening for infections with a predominantly sexual mode of transmission: Secondary | ICD-10-CM | POA: Diagnosis not present

## 2021-01-24 DIAGNOSIS — Z01419 Encounter for gynecological examination (general) (routine) without abnormal findings: Secondary | ICD-10-CM | POA: Diagnosis not present

## 2021-01-29 ENCOUNTER — Telehealth: Payer: Self-pay | Admitting: Family Medicine

## 2021-01-29 NOTE — Telephone Encounter (Signed)
Zpak not appropriate treatment for flu and would need a visit if needing medication for an acute issue.

## 2021-01-29 NOTE — Telephone Encounter (Signed)
°  Prescription Request  01/29/2021  Is this a "Controlled Substance" medicine? no  Have you seen your PCP in the last 2 weeks? no  If YES, route message to pool  -  If NO, patient needs to be scheduled for appointment.  What is the name of the medication or equipment? Daughter has flu and wants to know if dr will call in meds for her to prevent flu  Have you contacted your pharmacy to request a refill? no   Which pharmacy would you like this sent to? [facility]   Patient notified that their request is being sent to the clinical staff for review and that they should receive a response within 2 business days.

## 2021-01-29 NOTE — Telephone Encounter (Signed)
Pt aware.

## 2021-04-28 ENCOUNTER — Ambulatory Visit: Payer: BC Managed Care – PPO | Admitting: Family Medicine

## 2021-04-28 ENCOUNTER — Encounter: Payer: Self-pay | Admitting: Family Medicine

## 2021-04-28 DIAGNOSIS — R0602 Shortness of breath: Secondary | ICD-10-CM

## 2021-04-28 DIAGNOSIS — J0110 Acute frontal sinusitis, unspecified: Secondary | ICD-10-CM

## 2021-04-28 DIAGNOSIS — J40 Bronchitis, not specified as acute or chronic: Secondary | ICD-10-CM

## 2021-04-28 DIAGNOSIS — J45901 Unspecified asthma with (acute) exacerbation: Secondary | ICD-10-CM | POA: Diagnosis not present

## 2021-04-28 MED ORDER — PREDNISONE 10 MG PO TABS: ORAL_TABLET | ORAL | 0 refills | Status: DC

## 2021-04-28 MED ORDER — BUDESONIDE-FORMOTEROL FUMARATE 80-4.5 MCG/ACT IN AERO: 2.0000 | INHALATION_SPRAY | Freq: Two times a day (BID) | RESPIRATORY_TRACT | 12 refills | Status: AC

## 2021-04-28 MED ORDER — SULFAMETHOXAZOLE-TRIMETHOPRIM 800-160 MG PO TABS
1.0000 | ORAL_TABLET | Freq: Two times a day (BID) | ORAL | 0 refills | Status: DC
Start: 2021-04-28 — End: 2021-07-25

## 2021-04-28 MED ORDER — ALBUTEROL SULFATE HFA 108 (90 BASE) MCG/ACT IN AERS: 2.0000 | INHALATION_SPRAY | Freq: Four times a day (QID) | RESPIRATORY_TRACT | 2 refills | Status: AC | PRN

## 2021-04-28 NOTE — Progress Notes (Signed)
14 ? ? ?Subjective:  ? ? Patient ID: CHRISETTE MAN, female    DOB: 02/04/1991, 31 y.o.   MRN: [id number] ? ? ?HPI: ?MAYVIS AGUDELO is a 31 y.o. female presenting for chest feeling tight. Nose is burning. Eyes watering. Congested. Sinus HA at forehead. Runny nose is clear. Temp 99.8. Onset was 3 days ago, Coughing and short of breath.  ? ? ? ?Depression screen [facility] 2/9 03/27/2020 11/08/2017 10/13/2017 10/06/2017 01/05/2017  ?Decreased Interest 0 0 0 0 1  ?Down, Depressed, Hopeless 0 0 0 0 2  ?PHQ - 2 Score 0 0 0 0 3  ?Altered sleeping - - - - 1  ?Tired, decreased energy - - - - 2  ?Change in appetite - - - - 1  ?Feeling bad or failure about yourself  - - - - 0  ?Trouble concentrating - - - - 0  ?Moving slowly or fidgety/restless - - - - 0  ?Suicidal thoughts - - - - 0  ?PHQ-9 Score - - - - 7  ? ? ? ?Relevant past medical, surgical, family and social history reviewed and updated as indicated.  ?Interim medical history since our last visit reviewed. ?Allergies and medications reviewed and updated. ? ?ROS:  ?Review of Systems  ?Constitutional:  Negative for activity change, appetite change, chills and fever.  ?HENT:  Positive for congestion, postnasal drip, rhinorrhea and sinus pressure. Negative for ear pain and nosebleeds.   ?Respiratory:  Positive for cough, chest tightness and shortness of breath.   ?Cardiovascular:  Negative for palpitations.  ?Skin:  Negative for rash.   ? ?Social History  ? ?Tobacco Use  ?Smoking Status Never  ?Smokeless Tobacco Never  ? ? ?   ?Objective:  ?  ? ?Wt Readings from Last 3 Encounters:  ?11/08/17 188 lb (85.3 kg)  ?10/13/17 187 lb 6.4 oz (85 kg)  ?10/06/17 187 lb 6.4 oz (85 kg)  ?  ? ?Exam deferred. Pt. Harboring due to COVID 19. Phone visit performed.  ? ?Assessment & Plan:  ? ?1. Acute frontal sinusitis, recurrence not specified   ?2. Shortness of breath   ?3. Bronchitis with wheezing   ?4. Cough   ?5. Moderate asthma with exacerbation, unspecified whether persistent   ? ? ?Meds ordered  this encounter  ?Medications  ? albuterol (VENTOLIN HFA) 108 (90 Base) MCG/ACT inhaler  ?  Sig: Inhale 2 puffs into the lungs every 6 (six) hours as needed for wheezing or shortness of breath.  ?  Dispense:  8 g  ?  Refill:  2  ? budesonide-formoterol (SYMBICORT) 80-4.5 MCG/ACT inhaler  ?  Sig: Inhale 2 puffs into the lungs 2 (two) times daily.  ?  Dispense:  1 each  ?  Refill:  12  ? sulfamethoxazole-trimethoprim (BACTRIM DS) 800-160 MG tablet  ?  Sig: Take 1 tablet by mouth 2 (two) times daily. Until gone, for infection  ?  Dispense:  20 tablet  ?  Refill:  0  ? predniSONE (DELTASONE) 10 MG tablet  ?  Sig: Take 5 daily for 2 days followed by 4,3,2 and 1 for 2 days each.  ?  Dispense:  30 tablet  ?  Refill:  0  ? ? ?No orders of the defined types were placed in this encounter. ? ?  ? ?Diagnoses and all orders for this visit: ? ?Acute frontal sinusitis, recurrence not specified ? ?Shortness of breath ?-     albuterol (VENTOLIN HFA) 108 (90 Base) MCG/ACT inhaler;  Inhale 2 puffs into the lungs every 6 (six) hours as needed for wheezing or shortness of breath. ? ?Bronchitis with wheezing ?-     budesonide-formoterol (SYMBICORT) 80-4.5 MCG/ACT inhaler; Inhale 2 puffs into the lungs 2 (two) times daily. ? ?Cough ?-     budesonide-formoterol (SYMBICORT) 80-4.5 MCG/ACT inhaler; Inhale 2 puffs into the lungs 2 (two) times daily. ? ?Moderate asthma with exacerbation, unspecified whether persistent ? ?Other orders ?-     sulfamethoxazole-trimethoprim (BACTRIM DS) 800-160 MG tablet; Take 1 tablet by mouth 2 (two) times daily. Until gone, for infection ?-     predniSONE (DELTASONE) 10 MG tablet; Take 5 daily for 2 days followed by 4,3,2 and 1 for 2 days each. ? ? ? ?Virtual Visit via telephone Note ? ?I discussed the limitations, risks, security and privacy concerns of performing an evaluation and management service by telephone and the availability of in person appointments. The patient was identified with two identifiers.  Pt.expressed understanding and agreed to proceed. Pt. Is at home. Dr. Darlyn Read is in his office. ? ?Follow Up Instructions: ?  ?I discussed the assessment and treatment plan with the patient. The patient was provided an opportunity to ask questions and all were answered. The patient agreed with the plan and demonstrated an understanding of the instructions. ?  ?The patient was advised to call back or seek an in-person evaluation if the symptoms worsen or if the condition fails to improve as anticipated. ? ? ?Total minutes including chart review and phone contact time: 14 ? ? ?Follow up plan: ?Return if symptoms worsen or fail to improve. ? ?Mechele Claude, MD ?[facility] ? ?

## 2021-06-28 DIAGNOSIS — Z008 Encounter for other general examination: Secondary | ICD-10-CM | POA: Diagnosis not present

## 2021-06-28 DIAGNOSIS — J020 Streptococcal pharyngitis: Secondary | ICD-10-CM | POA: Diagnosis not present

## 2021-07-25 ENCOUNTER — Encounter: Payer: Self-pay | Admitting: Nurse Practitioner

## 2021-07-25 ENCOUNTER — Other Ambulatory Visit: Payer: Self-pay | Admitting: Nurse Practitioner

## 2021-07-25 ENCOUNTER — Ambulatory Visit: Payer: BC Managed Care – PPO | Admitting: Nurse Practitioner

## 2021-07-25 VITALS — BP 115/82 | HR 75 | Temp 98.2°F | Resp 20 | Ht 61.0 in | Wt 193.0 lb

## 2021-07-25 DIAGNOSIS — M546 Pain in thoracic spine: Secondary | ICD-10-CM

## 2021-07-25 DIAGNOSIS — J0110 Acute frontal sinusitis, unspecified: Secondary | ICD-10-CM

## 2021-07-25 MED ORDER — CYCLOBENZAPRINE HCL 5 MG PO TABS: 5.0000 mg | ORAL_TABLET | Freq: Three times a day (TID) | ORAL | 0 refills | Status: AC | PRN

## 2021-07-25 MED ORDER — NAPROXEN 500 MG PO TABS: 500.0000 mg | ORAL_TABLET | Freq: Two times a day (BID) | ORAL | 1 refills | Status: AC

## 2021-07-25 NOTE — Patient Instructions (Signed)
Acute Back Pain, Adult Acute back pain is sudden and usually short-lived. It is often caused by an injury to the muscles and tissues in the back. The injury may result from: A muscle, tendon, or ligament getting overstretched or torn. Ligaments are tissues that connect bones to each other. Lifting something improperly can cause a back strain. Wear and tear (degeneration) of the spinal disks. Spinal disks are circular tissue that provide cushioning between the bones of the spine (vertebrae). Twisting motions, such as while playing sports or doing yard work. A hit to the back. Arthritis. You may have a physical exam, lab tests, and imaging tests to find the cause of your pain. Acute back pain usually goes away with rest and home care. Follow these instructions at home: Managing pain, stiffness, and swelling Take over-the-counter and prescription medicines only as told by your health care provider. Treatment may include medicines for pain and inflammation that are taken by mouth or applied to the skin, or muscle relaxants. Your health care provider may recommend applying ice during the first 24-48 hours after your pain starts. To do this: Put ice in a plastic bag. Place a towel between your skin and the bag. Leave the ice on for 20 minutes, 2-3 times a day. Remove the ice if your skin turns bright red. This is very important. If you cannot feel pain, heat, or cold, you have a greater risk of damage to the area. If directed, apply heat to the affected area as often as told by your health care provider. Use the heat source that your health care provider recommends, such as a moist heat pack or a heating pad. Place a towel between your skin and the heat source. Leave the heat on for 20-30 minutes. Remove the heat if your skin turns bright red. This is especially important if you are unable to feel pain, heat, or cold. You have a greater risk of getting burned. Activity  Do not stay in bed. Staying in  bed for more than 1-2 days can delay your recovery. Sit up and stand up straight. Avoid leaning forward when you sit or hunching over when you stand. If you work at a desk, sit close to it so you do not need to lean over. Keep your chin tucked in. Keep your neck drawn back, and keep your elbows bent at a 90-degree angle (right angle). Sit high and close to the steering wheel when you drive. Add lower back (lumbar) support to your car seat, if needed. Take short walks on even surfaces as soon as you are able. Try to increase the length of time you walk each day. Do not sit, drive, or stand in one place for more than 30 minutes at a time. Sitting or standing for long periods of time can put stress on your back. Do not drive or use heavy machinery while taking prescription pain medicine. Use proper lifting techniques. When you bend and lift, use positions that put less stress on your back: Bend your knees. Keep the load close to your body. Avoid twisting. Exercise regularly as told by your health care provider. Exercising helps your back heal faster and helps prevent back injuries by keeping muscles strong and flexible. Work with a physical therapist to make a safe exercise program, as recommended by your health care provider. Do any exercises as told by your physical therapist. Lifestyle Maintain a healthy weight. Extra weight puts stress on your back and makes it difficult to have good   posture. Avoid activities or situations that make you feel anxious or stressed. Stress and anxiety increase muscle tension and can make back pain worse. Learn ways to manage anxiety and stress, such as through exercise. General instructions Sleep on a firm mattress in a comfortable position. Try lying on your side with your knees slightly bent. If you lie on your back, put a pillow under your knees. Keep your head and neck in a straight line with your spine (neutral position) when using electronic equipment like  smartphones or pads. To do this: Raise your smartphone or pad to look at it instead of bending your head or neck to look down. Put the smartphone or pad at the level of your face while looking at the screen. Follow your treatment plan as told by your health care provider. This may include: Cognitive or behavioral therapy. Acupuncture or massage therapy. Meditation or yoga. Contact a health care provider if: You have pain that is not relieved with rest or medicine. You have increasing pain going down into your legs or buttocks. Your pain does not improve after 2 weeks. You have pain at night. You lose weight without trying. You have a fever or chills. You develop nausea or vomiting. You develop abdominal pain. Get help right away if: You develop new bowel or bladder control problems. You have unusual weakness or numbness in your arms or legs. You feel faint. These symptoms may represent a serious problem that is an emergency. Do not wait to see if the symptoms will go away. Get medical help right away. Call your local emergency services (911 in the [place] Do not drive yourself to the hospital. Summary Acute back pain is sudden and usually short-lived. Use proper lifting techniques. When you bend and lift, use positions that put less stress on your back. Take over-the-counter and prescription medicines only as told by your health care provider, and apply heat or ice as told. This information is not intended to replace advice given to you by your health care provider. Make sure you discuss any questions you have with your health care provider. Document Revised: 04/26/2020 Document Reviewed: 04/26/2020 Elsevier Patient Education  2023 [employer]  

## 2021-07-25 NOTE — Telephone Encounter (Signed)
I've literally never seen this patient and there are no recent labs.  Please have her schedule appropriate OV for physical/ fasting labs
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
## 2021-07-28 ENCOUNTER — Encounter: Payer: Self-pay | Admitting: *Deleted

## 2021-07-28 NOTE — Telephone Encounter (Signed)
Sent mychart message for her to set up appt

## 2021-08-08 ENCOUNTER — Encounter: Payer: Self-pay | Admitting: Family Medicine

## 2021-08-08 ENCOUNTER — Ambulatory Visit: Payer: BC Managed Care – PPO | Admitting: Family Medicine

## 2021-08-08 VITALS — BP 130/82 | HR 84 | Temp 98.0°F | Ht 61.0 in | Wt 195.4 lb

## 2021-08-08 DIAGNOSIS — E039 Hypothyroidism, unspecified: Secondary | ICD-10-CM | POA: Diagnosis not present

## 2021-08-08 DIAGNOSIS — R5383 Other fatigue: Secondary | ICD-10-CM | POA: Diagnosis not present

## 2021-08-08 DIAGNOSIS — Z87898 Personal history of other specified conditions: Secondary | ICD-10-CM

## 2021-08-08 DIAGNOSIS — J4540 Moderate persistent asthma, uncomplicated: Secondary | ICD-10-CM | POA: Insufficient documentation

## 2021-08-08 MED ORDER — LEVOTHYROXINE SODIUM 75 MCG PO TABS: 75.0000 ug | ORAL_TABLET | Freq: Every day | ORAL | 0 refills | Status: DC

## 2021-08-08 MED ORDER — MECLIZINE HCL 12.5 MG PO TABS: 12.5000 mg | ORAL_TABLET | Freq: Three times a day (TID) | ORAL | 0 refills | Status: AC | PRN

## 2021-08-08 NOTE — Progress Notes (Signed)
New Patient Office Visit  Subjective    Patient ID: Sara Clarke, female    DOB: 09-07-1990  Age: 31 y.o. MRN: [id number]  CC:  Chief Complaint  Patient presents with   Medical Management of Chronic Issues   Hypothyroidism    HPI Sara Clarke presents to establish care. She has a history of hypothyroidism. She has been taking levothyroxine 75 mcg daily but has been out of this for the last 2 weeks. She takes this in the evening with her other medications. She does report fatigue for the last 2 weeks. She also has a history of asthma. She reports that her symptoms are only occasionally. She she uses her Symbicort inhaler prn and never uses her albuterol inhaler. She is established with an Ob/Gyn.   She reports a history of motion sickness. She will be going on a cruise in August and would like to know what she can take. She took dramamine on her last cruise without much relief.   Outpatient Encounter Medications as of 08/08/2021  Medication Sig   albuterol (VENTOLIN HFA) 108 (90 Base) MCG/ACT inhaler Inhale 2 puffs into the lungs every 6 (six) hours as needed for wheezing or shortness of breath.   budesonide-formoterol (SYMBICORT) 80-4.5 MCG/ACT inhaler Inhale 2 puffs into the lungs 2 (two) times daily.   cyclobenzaprine (FLEXERIL) 5 MG tablet Take 1 tablet (5 mg total) by mouth 3 (three) times daily as needed for muscle spasms.   drospirenone-ethinyl estradiol (YAZ,GIANVI,LORYNA) 3-0.02 MG tablet Take 1 tablet by mouth daily.   levothyroxine (SYNTHROID) 75 MCG tablet Take 1 tablet (75 mcg total) by mouth daily.   naproxen (NAPROSYN) 500 MG tablet Take 1 tablet (500 mg total) by mouth 2 (two) times daily with a meal.   No facility-administered encounter medications on file as of 08/08/2021.    Past Medical History:  Diagnosis Date   Asthma    Hypothyroidism    PONV (postoperative nausea and vomiting)    nausea/vomiting with previous c section.    Past Surgical History:   Procedure Laterality Date   CESAREAN SECTION     CESAREAN SECTION N/A 12/11/2015   Procedure: CESAREAN SECTION;  Surgeon: Waynard Reeds, MD;  Location: [facility];  Service: Obstetrics;  Laterality: N/A;    Family History  Problem Relation Age of Onset   Thyroid disease Mother    Hypertension Mother    Hypertension Father    Cancer Maternal Grandfather        colon    Social History   Socioeconomic History   Marital status: Married    Spouse name: Apolinar Junes   Number of children: 2   Years of education: 12   Highest education level: High school graduate  Occupational History   Not on file  Tobacco Use   Smoking status: Never   Smokeless tobacco: Never  Vaping Use   Vaping Use: Never used  Substance and Sexual Activity   Alcohol use: No    Alcohol/week: 0.0 standard drinks of alcohol   Drug use: No   Sexual activity: Yes    Birth control/protection: Pill  Other Topics Concern   Not on file  Social History Narrative   Not on file   Social Determinants of Health   Financial Resource Strain: Not on file  Food Insecurity: Not on file  Transportation Needs: Not on file  Physical Activity: Not on file  Stress: Not on file  Social Connections: Not on file  Intimate Partner  Violence: Not on file    ROS Negative unless specially indicated above in HPI.     Objective    BP 130/82   Pulse 84   Temp 98 F (36.7 C) (Temporal)   Ht 5\' 1"  (1.549 m)   Wt 195 lb 6 oz (88.6 kg)   SpO2 100%   BMI 36.92 kg/m   Physical Exam Vitals and nursing note reviewed.  Constitutional:      General: She is not in acute distress.    Appearance: She is not ill-appearing, toxic-appearing or diaphoretic.  HENT:     Head: Normocephalic and atraumatic.  Neck:     Thyroid: No thyroid mass, thyromegaly or thyroid tenderness.  Cardiovascular:     Rate and Rhythm: Normal rate and regular rhythm.     Heart sounds: Normal heart sounds. No murmur heard. Pulmonary:     Effort:  Pulmonary effort is normal. No respiratory distress.     Breath sounds: Normal breath sounds.  Abdominal:     General: Bowel sounds are normal. There is no distension.     Palpations: Abdomen is soft.     Tenderness: There is no abdominal tenderness. There is no guarding or rebound.  Musculoskeletal:     Right lower leg: No edema.     Left lower leg: No edema.  Lymphadenopathy:     Cervical: No cervical adenopathy.  Skin:    General: Skin is warm and dry.  Neurological:     General: No focal deficit present.     Mental Status: She is alert and oriented to person, place, and time.  Psychiatric:        Mood and Affect: Mood normal.        Thought Content: Thought content normal.        Judgment: Judgment normal.         Assessment & Plan:   Sebastian was seen today for medical management of chronic issues and hypothyroidism.  Diagnoses and all orders for this visit:  Acquired hypothyroidism Lab spending. Refill provided. Discussed compliance with medication as she has been out for 2 weeks. Also discussed to take on an empty stomach with no other medications for best absorption.  -     Thyroid Panel With TSH -     levothyroxine (SYNTHROID) 75 MCG tablet; Take 1 tablet (75 mcg total) by mouth daily.  Fatigue, unspecified type Discussed likely because she has been out of synthroid for 2 weeks. Will check labs as below.  -     CBC with Differential/Platelet -     CMP14+EGFR -     Thyroid Panel With TSH -     VITAMIN D 25 Hydroxy (Vit-D Deficiency, Fractures)  Moderate persistent asthma without complication Well controlled. Discussed that symbicort should be used daily and albuterol prn. Discussed that she can try without symbicort but if she needs albuterol 2 or more times a week then she should restart symbicort.   Morbid obesity (HCC) Diet and exercise. Fasting labs pending.  -     CBC with Differential/Platelet -     CMP14+EGFR -     Lipid panel -     Thyroid Panel With  TSH  H/O motion sickness -     meclizine (ANTIVERT) 12.5 MG tablet; Take 1-2 tablets (12.5-25 mg total) by mouth 3 (three) times daily as needed for dizziness.  Return in about 3 months (around 11/08/2021) for chronic follow up.   The patient indicates understanding of these issues  and agrees with the plan.  Gabriel Earing, FNP

## 2021-08-09 LAB — CMP14+EGFR
ALT: 13 IU/L (ref 0–32)
AST: 13 IU/L (ref 0–40)
Albumin/Globulin Ratio: 1.4 (ref 1.2–2.2)
Albumin: 4.1 g/dL (ref 3.8–4.8)
Alkaline Phosphatase: 99 IU/L (ref 44–121)
BUN/Creatinine Ratio: 14 (ref 9–23)
BUN: 10 mg/dL (ref 6–20)
Bilirubin Total: 0.3 mg/dL (ref 0.0–1.2)
CO2: 20 mmol/L (ref 20–29)
Calcium: 9.6 mg/dL (ref 8.7–10.2)
Chloride: 102 mmol/L (ref 96–106)
Creatinine, Ser: 0.71 mg/dL (ref 0.57–1.00)
Globulin, Total: 3 g/dL (ref 1.5–4.5)
Glucose: 86 mg/dL (ref 70–99)
Potassium: 5 mmol/L (ref 3.5–5.2)
Sodium: 140 mmol/L (ref 134–144)
Total Protein: 7.1 g/dL (ref 6.0–8.5)
eGFR: 117 mL/min/{1.73_m2} (ref >59)

## 2021-08-09 LAB — CBC WITH DIFFERENTIAL/PLATELET
Basophils Absolute: 0.1 10*3/uL (ref 0.0–0.2)
Basos: 1 %
EOS (ABSOLUTE): 0.2 10*3/uL (ref 0.0–0.4)
Eos: 2 %
Hematocrit: 39.2 % (ref 34.0–46.6)
Hemoglobin: 13 g/dL (ref 11.1–15.9)
Immature Grans (Abs): 0 10*3/uL (ref 0.0–0.1)
Immature Granulocytes: 0 %
Lymphocytes Absolute: 3.4 10*3/uL — ABNORMAL HIGH (ref 0.7–3.1)
Lymphs: 31 %
MCH: 26.9 pg (ref 26.6–33.0)
MCHC: 33.2 g/dL (ref 31.5–35.7)
MCV: 81 fL (ref 79–97)
Monocytes Absolute: 0.7 10*3/uL (ref 0.1–0.9)
Monocytes: 6 %
Neutrophils Absolute: 6.7 10*3/uL (ref 1.4–7.0)
Neutrophils: 60 %
Platelets: 404 10*3/uL (ref 150–450)
RBC: 4.84 x10E6/uL (ref 3.77–5.28)
RDW: 13 % (ref 11.7–15.4)
WBC: 11.1 10*3/uL — ABNORMAL HIGH (ref 3.4–10.8)

## 2021-08-09 LAB — THYROID PANEL WITH TSH
Free Thyroxine Index: 1.2 (ref 1.2–4.9)
T3 Uptake Ratio: 11 % — ABNORMAL LOW (ref 24–39)
T4, Total: 11.3 ug/dL (ref 4.5–12.0)
TSH: 10.9 u[IU]/mL — ABNORMAL HIGH (ref 0.450–4.500)

## 2021-08-09 LAB — LIPID PANEL
Chol/HDL Ratio: 3.6 ratio (ref 0.0–4.4)
Cholesterol, Total: 210 mg/dL — ABNORMAL HIGH (ref 100–199)
HDL: 58 mg/dL (ref >39)
LDL Chol Calc (NIH): 128 mg/dL — ABNORMAL HIGH (ref 0–99)
Triglycerides: 136 mg/dL (ref 0–149)
VLDL Cholesterol Cal: 24 mg/dL (ref 5–40)

## 2021-08-09 LAB — VITAMIN D 25 HYDROXY (VIT D DEFICIENCY, FRACTURES): Vit D, 25-Hydroxy: 35 ng/mL (ref 30.0–100.0)

## 2021-11-07 ENCOUNTER — Telehealth: Payer: Self-pay | Admitting: Family Medicine

## 2021-11-07 DIAGNOSIS — E039 Hypothyroidism, unspecified: Secondary | ICD-10-CM

## 2021-11-10 ENCOUNTER — Other Ambulatory Visit: Payer: BC Managed Care – PPO

## 2021-11-10 DIAGNOSIS — E039 Hypothyroidism, unspecified: Secondary | ICD-10-CM | POA: Diagnosis not present

## 2021-11-10 NOTE — Telephone Encounter (Signed)
Ordered

## 2021-11-10 NOTE — Telephone Encounter (Signed)
Patient aware.

## 2021-11-11 ENCOUNTER — Other Ambulatory Visit: Payer: Self-pay | Admitting: *Deleted

## 2021-11-11 ENCOUNTER — Ambulatory Visit: Payer: BC Managed Care – PPO | Admitting: Family Medicine

## 2021-11-11 DIAGNOSIS — E039 Hypothyroidism, unspecified: Secondary | ICD-10-CM

## 2021-11-11 LAB — TSH: TSH: 4.44 u[IU]/mL (ref 0.450–4.500)

## 2021-11-11 MED ORDER — LEVOTHYROXINE SODIUM 75 MCG PO TABS: 75.0000 ug | ORAL_TABLET | Freq: Every day | ORAL | 0 refills | Status: DC

## 2022-01-01 ENCOUNTER — Encounter: Payer: Self-pay | Admitting: Nurse Practitioner

## 2022-01-01 ENCOUNTER — Ambulatory Visit: Payer: BC Managed Care – PPO | Admitting: Nurse Practitioner

## 2022-01-01 VITALS — BP 128/84 | HR 91 | Temp 98.4°F | Resp 20 | Ht 61.0 in | Wt 200.0 lb

## 2022-01-01 DIAGNOSIS — J0100 Acute maxillary sinusitis, unspecified: Secondary | ICD-10-CM | POA: Diagnosis not present

## 2022-01-01 MED ORDER — AMOXICILLIN-POT CLAVULANATE 875-125 MG PO TABS: 1.0000 | ORAL_TABLET | Freq: Two times a day (BID) | ORAL | 0 refills | Status: AC

## 2022-01-01 NOTE — Patient Instructions (Signed)
1. Take meds as prescribed 2. Use a cool mist humidifier especially during the winter months and when heat has been humid. 3. Use saline nose sprays frequently 4. Saline irrigations of the nose can be very helpful if done frequently.  * 4X daily for 1 week*  * Use of a nettie pot can be helpful with this. Follow directions with this* 5. Drink plenty of fluids 6. Keep thermostat turn down low 7.For any cough or congestion- mucinex OTC 8. For fever or aces or pains- take tylenol or ibuprofen appropriate for age and weight.  * for fevers greater than 101 orally you may alternate ibuprofen and tylenol every  3 hours.    

## 2022-01-01 NOTE — Progress Notes (Signed)
Subjective:    Patient ID: Sara Clarke, female    DOB: [birth year], 31 y.o.   MRN: [id number]   Chief Complaint: Sinus Problem (No fever/)   Sinus Problem This is a new problem. The current episode started 1 to 4 weeks ago (4 weeks). The problem is unchanged. There has been no fever. Her pain is at a severity of 5/10. Associated symptoms include congestion, coughing, ear pain, sinus pressure and sneezing. Pertinent negatives include no chills, headaches, shortness of breath, sore throat or swollen glands. Past treatments include nasal decongestants and oral decongestants. The treatment provided no relief.       Review of Systems  Constitutional:  Negative for chills, fatigue and fever.  HENT:  Positive for congestion, ear pain, postnasal drip, sinus pressure, sinus pain and sneezing. Negative for rhinorrhea and sore throat.   Respiratory:  Positive for cough. Negative for chest tightness and shortness of breath.   Cardiovascular:  Negative for chest pain.  Gastrointestinal:  Negative for nausea and vomiting.  Neurological:  Negative for headaches.  All other systems reviewed and are negative.      Objective:   Physical Exam Vitals and nursing note reviewed.  Constitutional:      General: She is not in acute distress.    Appearance: Normal appearance. She is normal weight. She is not toxic-appearing.  HENT:     Head: Normocephalic and atraumatic.     Right Ear: Tympanic membrane normal.     Left Ear: Tympanic membrane normal.     Nose: Congestion present.     Mouth/Throat:     Mouth: Mucous membranes are moist.     Pharynx: Posterior oropharyngeal erythema present.  Eyes:     Conjunctiva/sclera: Conjunctivae normal.     Pupils: Pupils are equal, round, and reactive to light.  Cardiovascular:     Rate and Rhythm: Normal rate and regular rhythm.     Pulses: Normal pulses.     Heart sounds: Normal heart sounds.  Pulmonary:     Effort: Pulmonary effort is normal. No  respiratory distress.     Breath sounds: Normal breath sounds. No wheezing, rhonchi or rales.  Abdominal:     General: Abdomen is flat.  Musculoskeletal:     Cervical back: No tenderness.  Lymphadenopathy:     Cervical: No cervical adenopathy.  Skin:    General: Skin is warm and dry.  Neurological:     General: No focal deficit present.     Mental Status: She is alert and oriented to person, place, and time.  Psychiatric:        Mood and Affect: Mood normal.        Behavior: Behavior normal.      BP 128/84   Pulse 91   Temp 98.4 F (36.9 C) (Temporal)   Resp 20   Ht 5\' 1"  (1.549 m)   Wt 200 lb (90.7 kg)   SpO2 99%   BMI 37.79 kg/m       Assessment & Plan:   Remmy L Emily in today with chief complaint of Sinus Problem (No fever/)   1. Acute non-recurrent maxillary sinusitis Force fluids OTC tylenol or motrin  Saline nasal spray Cool mist humidifier - amoxicillin-clavulanate (AUGMENTIN) 875-125 MG tablet; Take 1 tablet by mouth 2 (two) times daily.  Dispense: 14 tablet; Refill: 0    The above assessment and management plan was discussed with the patient. The patient verbalized understanding of and has agreed to the management  plan. Patient is aware to call the clinic if symptoms persist or worsen. Patient is aware when to return to the clinic for a follow-up visit. Patient educated on when it is appropriate to go to the emergency department.   Consuello Bossier, FNP student  Bennie Pierini, FNP

## 2022-03-17 ENCOUNTER — Other Ambulatory Visit: Payer: Self-pay | Admitting: Family Medicine

## 2022-03-17 DIAGNOSIS — E039 Hypothyroidism, unspecified: Secondary | ICD-10-CM

## 2022-11-18 ENCOUNTER — Other Ambulatory Visit: Payer: Self-pay | Admitting: Family Medicine

## 2022-11-18 DIAGNOSIS — E039 Hypothyroidism, unspecified: Secondary | ICD-10-CM

## 2022-11-19 NOTE — Telephone Encounter (Signed)
Tiffany NTBS last OV 08/08/21 NO RF sent to pharmacy since last OV greater than a year

## 2022-11-19 NOTE — Telephone Encounter (Signed)
Called pt to schedule appt, she declined stated she will be switching PCP office I told pt I would remove Sara Clarke as her PCP she said not to do it yet because she is looking for PCP
